# Patient Record
Sex: Female | Born: 1935 | Hispanic: Refuse to answer
Health system: Midwestern US, Community
[De-identification: ages and names within clinical notes are randomized; demographics above are authoritative.]

## PROBLEM LIST (undated history)

## (undated) DIAGNOSIS — M5416 Radiculopathy, lumbar region: Secondary | ICD-10-CM

## (undated) DIAGNOSIS — R748 Abnormal levels of other serum enzymes: Secondary | ICD-10-CM

## (undated) DIAGNOSIS — R159 Full incontinence of feces: Secondary | ICD-10-CM

## (undated) DIAGNOSIS — R011 Cardiac murmur, unspecified: Secondary | ICD-10-CM

---

## 2019-03-25 NOTE — Progress Notes (Signed)
Self Swab Type: Anterior Nasal

## 2022-01-20 NOTE — Telephone Encounter (Signed)
Dr. Tamala Julian has accepted her as a new patient and we will call her to schedule an appt.

## 2022-01-21 NOTE — Telephone Encounter (Signed)
PT called, but VM Box has not been set up

## 2022-01-23 NOTE — Telephone Encounter (Signed)
I spoke with pt - appt made for 6/5 @ 11 AM

## 2022-01-31 NOTE — Telephone Encounter (Signed)
Pt called to confirm her 02/03/22 appt

## 2022-02-03 ENCOUNTER — Ambulatory Visit: Admit: 2022-02-03 | Discharge: 2022-02-03 | Payer: MEDICARE | Attending: Internal Medicine | Primary: Internal Medicine

## 2022-02-03 DIAGNOSIS — I6522 Occlusion and stenosis of left carotid artery: Secondary | ICD-10-CM

## 2022-02-03 LAB — COMPREHENSIVE METABOLIC PANEL
ALT: 32 U/L (ref 0–35)
AST: 36 U/L — ABNORMAL HIGH (ref 0–35)
Albumin/Globulin Ratio: 1.1 (ref 1.00–2.70)
Albumin: 3.9 g/dL (ref 3.5–5.2)
Alk Phosphatase: 125 U/L — ABNORMAL HIGH (ref 35–117)
Anion Gap: 9 mmol/L (ref 2–17)
BUN: 13 mg/dL (ref 8–23)
CO2: 29 mmol/L (ref 22–29)
Calcium: 9.6 mg/dL (ref 8.8–10.2)
Chloride: 100 mmol/L (ref 98–107)
Creatinine: 0.7 mg/dL (ref 0.5–1.0)
Est, Glom Filt Rate: 85 mL/min/{1.73_m2} (ref >=60)
Globulin: 3.4 g/dL (ref 1.9–4.4)
Glucose: 92 mg/dL (ref 70–99)
OSMOLALITY CALCULATED: 275 mOsm/kg (ref 270–287)
Potassium: 4.8 mmol/L (ref 3.5–5.3)
Sodium: 138 mmol/L (ref 135–145)
Total Bilirubin: 0.39 mg/dL (ref 0.00–1.20)
Total Protein: 7.3 g/dL (ref 6.4–8.3)

## 2022-02-03 LAB — TSH: TSH, 3RD GENERATION: 1.74 mcIU/mL (ref 0.358–3.740)

## 2022-02-03 LAB — CBC WITH AUTO DIFFERENTIAL
Absolute Baso #: 0.1 10*3/uL (ref 0.0–0.2)
Absolute Eos #: 0.2 10*3/uL (ref 0.0–0.5)
Absolute Lymph #: 1.9 10*3/uL (ref 1.0–3.2)
Absolute Mono #: 0.6 10*3/uL (ref 0.3–1.0)
Basophils %: 1.3 % (ref 0.0–2.0)
Eosinophils %: 2.3 % (ref 0.0–7.0)
Hematocrit: 42.6 % (ref 34.0–47.0)
Hemoglobin: 13.6 g/dL (ref 11.5–15.7)
Immature Grans (Abs): 0.01 10*3/uL (ref 0.00–0.06)
Immature Granulocytes: 0.1 % (ref 0.0–0.6)
Lymphocytes: 27.3 % (ref 15.0–45.0)
MCH: 29.4 pg (ref 27.0–34.5)
MCHC: 31.9 g/dL — ABNORMAL LOW (ref 32.0–36.0)
MCV: 92.2 fL (ref 81.0–99.0)
MPV: 10.8 fL (ref 7.2–13.2)
Monocytes: 8.2 % (ref 4.0–12.0)
NRBC Absolute: 0 10*3/uL (ref 0.000–0.012)
NRBC Automated: 0 % (ref 0.0–0.2)
Neutrophils %: 60.8 % (ref 42.0–74.0)
Neutrophils Absolute: 4.3 10*3/uL (ref 1.6–7.3)
Platelets: 263 10*3/uL (ref 140–440)
RBC: 4.62 x10e6/mcL (ref 3.60–5.20)
RDW: 14 % (ref 11.0–16.0)
WBC: 7 10*3/uL (ref 3.8–10.6)

## 2022-02-03 LAB — LIPID PANEL
Chol/HDL Ratio: 3.8 (ref 0.0–4.4)
Cholesterol: 224 mg/dL — ABNORMAL HIGH (ref 100–200)
HDL: 59 mg/dL (ref >=50)
LDL Cholesterol: 139.2 mg/dL — ABNORMAL HIGH (ref 0.0–100.0)
LDL/HDL Ratio: 2.4
Triglycerides: 129 mg/dL (ref 0–149)
VLDL: 25.8 mg/dL (ref 5.0–40.0)

## 2022-02-03 LAB — VITAMIN B12: Vitamin B-12: 449 pg/mL (ref 232–1245)

## 2022-02-04 ENCOUNTER — Telehealth

## 2022-02-04 NOTE — Telephone Encounter (Signed)
-----   Message from Leafy Ro, MD sent at 02/03/2022 11:29 PM EDT -----  Notify liver enzymes slightly elevated otherwise labs okay please recheck CMP before she comes back in 3 months for her next appointment diagnosis elevated liver enzymes

## 2022-02-05 NOTE — Progress Notes (Signed)
CHIEF COMPLAINT:  Chief Complaint   Patient presents with    New Patient        HISTORY OF PRESENT ILLNESS:  [name] is a 86 y.o. female  who presents today to establish as a new patient.  She says she is from [place] and has relocated to [place].  She said she saw doctors infrequently but does carry a diagnosis of carotid stenosis and a heart murmur she thinks the aortic valve.  She is living in an apartment now she is a little bit socially isolated.  Her children do not live in the area she does have a son in [place] who travels quite a bit.  She sold her house on [address] because it is difficult to maintain the upkeep and she could not tolerate the ice and snow in the winter.  She has back pain which bothers her throughout the day.  She has not been falling.  She does not get much activity does not leave her house very much.  She complains of difficulty with urinary and fecal incontinence recently.  She says she was riding one of the carts in the store and somebody informed her that she was incontinent because she was unaware of it.  She says fecal incontinence does not happen every week but urinary incontinence is frequent.  She feels her short-term memory may not be very good but she is able to pay her bills and manage her household affairs.  Usually has difficulty recalling names.    PHQ:  PHQ-9  02/03/2022   Little interest or pleasure in doing things 0   Feeling down, depressed, or hopeless 0   PHQ-2 Score 0   PHQ-9 Total Score 0       CURRENT MEDICATION LIST:    No current outpatient medications on file.     No current facility-administered medications for this visit.        ALLERGIES:    No Known Allergies     HISTORY:  History reviewed. No pertinent past medical history.   Past Surgical History:   Procedure Laterality Date    LITHOTRIPSY      TONSILLECTOMY AND ADENOIDECTOMY        Social History     Socioeconomic History    Marital status: Divorced     Spouse name: Not on file    Number  of children: Not on file    Years of education: Not on file    Highest education level: Not on file   Occupational History    Not on file   Tobacco Use    Smoking status: Never    Smokeless tobacco: Never   Substance and Sexual Activity    Alcohol use: Never    Drug use: Never    Sexual activity: Not Currently     Partners: Male   Other Topics Concern    Not on file   Social History Narrative    Not on file     Social Determinants of Health     Financial Resource Strain: Not on file   Food Insecurity: Not on file   Transportation Needs: Not on file   Physical Activity: Not on file   Stress: Not on file   Social Connections: Not on file   Intimate Partner Violence: Not on file   Housing Stability: Not on file      Family History   Problem Relation Age of Onset    Diabetes Mother  No Known Problems Father     Cancer Brother         Throat Cancer    Alzheimer's Disease Brother     Diabetes Paternal Grandfather         REVIEW OF SYSTEMS:  Pertinent items are noted in HPI.    PHYSICAL EXAM:  Vital Signs -   Visit Vitals  BP 132/70   Pulse 74   Resp 16   Ht '5\' 8"'  (1.727 m)   Wt 197 lb (89.4 kg)   SpO2 97%   BMI 29.95 kg/m    Body mass index is 29.95 kg/m.   1) Well-nourished, well-developed in no acute distress   2) PERRLA    3) oropharynx is clear with moist mucous membranes  4) neck is supple with no lymphadenopathy   5) thyroid is not enlarged   6) lungs are clear to auscultation bilaterally with good air movement   7) respiratory effort is normal with no accessory muscle recruitment   8) heart's regular rate and rhythm with normal S1 and normal S2   9) there is no clubbing or cyanosis   10)there is no edema   11)no rashes, lesions or ulcers   12)alert and oriented to person place and time      LABS  Results for orders placed or performed in visit on 02/03/22   CBC with Auto Differential   Result Value Ref Range    WBC 7.0 3.8 - 10.6 x10e3/mcL    RBC 4.62 3.60 - 5.20 x10e6/mcL    Hemoglobin 13.6 11.5 - 15.7 g/dL     Hematocrit 42.6 34.0 - 47.0 %    MCV 92.2 81.0 - 99.0 fL    MCH 29.4 27.0 - 34.5 pg    MCHC 31.9 (L) 32.0 - 36.0 g/dL    RDW 14.0 11.0 - 16.0 %    Platelets 263 140 - 440 x10e3/mcL    MPV 10.8 7.2 - 13.2 fL    NRBC Automated 0.0 0.0 - 0.2 %    NRBC Absolute 0.000 0.000 - 0.012 x10e3/mcL    Neutrophils % 60.8 42.0 - 74.0 %    Lymphocytes 27.3 15.0 - 45.0 %    Monocytes 8.2 4.0 - 12.0 %    Eosinophils % 2.3 0.0 - 7.0 %    Basophils % 1.3 0.0 - 2.0 %    Neutrophils Absolute 4.3 1.6 - 7.3 x10e3/mcL    Absolute Lymph # 1.9 1.0 - 3.2 x10e3/mcL    Absolute Mono # 0.6 0.3 - 1.0 x10e3/mcL    Absolute Eos # 0.2 0.0 - 0.5 x10e3/mcL    Absolute Baso # 0.1 0.0 - 0.2 x10e3/mcL    Immature Granulocytes 0.1 0.0 - 0.6 %    Immature Grans (Abs) 0.01 0.00 - 0.06 x10e3/mcL   Comprehensive Metabolic Panel   Result Value Ref Range    Sodium 138 135 - 145 mmol/L    Potassium 4.8 3.5 - 5.3 mmol/L    Chloride 100 98 - 107 mmol/L    CO2 29 22 - 29 mmol/L    Glucose 92 70 - 99 mg/dL    BUN 13 8 - 23 mg/dL    Creatinine 0.7 0.5 - 1.0 mg/dL    Anion Gap 9 2 - 17 mmol/L    OSMOLALITY CALCULATED 275 270 - 287 mOsm/kg    Calcium 9.6 8.8 - 10.2 mg/dL    Total Protein 7.3 6.4 - 8.3 g/dL    Albumin 3.9 3.5 -  5.2 g/dL    Globulin 3.4 1.9 - 4.4 g/dL    Albumin/Globulin Ratio 1.10 1.00 - 2.70    Total Bilirubin 0.39 0.00 - 1.20 mg/dL    Alk Phosphatase 125 (H) 35 - 117 unit/L    AST 36 (H) 0 - 35 unit/L    ALT 32 0 - 35 unit/L    Est, Glom Filt Rate 85 >=60 mL/min/1.17m  Lipid Panel   Result Value Ref Range    Cholesterol 224 (H) 100 - 200 mg/dL    HDL 59 >=50 mg/dL    Triglycerides 129 0 - 149 mg/dL    LDL Cholesterol 139.2 (H) 0.0 - 100.0 mg/dL    LDL/HDL Ratio 2.4     Chol/HDL Ratio 3.8 0.0 - 4.4    VLDL 25.8 5.0 - 40.0 mg/dL   TSH   Result Value Ref Range    TSH, 3RD GENERATION 1.740 0.358 - 3.740 mcIU/mL   Vitamin B12   Result Value Ref Range    Vitamin B-12 449 232 - 1245 pg/mL     No results found for any previous visit.        IMPRESSION/PLAN    1. Stenosis of left carotid artery  Comments:  [place] 2. Aortic valve sclerosis  Comments:  Appears asymptomatic, get echo results  3. Chronic lumbar radiculopathy  Comments:  Noted, arrange physical therapy  Orders:  -     [facility] - [facility] - [address]  4. Elevated blood pressure reading  Comments:  Asked her to monitor blood pressure at home, check labs  Orders:  -     CBC with Auto Differential  -     Comprehensive Metabolic Panel  5. Elevated lipids  Comments:  Check lipid panel  Orders:  -     Lipid Panel  6. Stress incontinence of urine  Comments:  Referral to urology, probably some element of pelvic floor dysfunction  Orders:  -     MRosezetta Schlatter MD - Urology  7. Incontinence of feces, unspecified fecal incontinence type  Comments:  As above, referral to colorectal specialty  Orders:  -     [facility] - Lagares-Garcia, JChipper Oman MD, Colorectal Surgery - [address] 8. Cognitive impairment  Comments:  Check labs including B12, TSH RPR  Orders:  -     TSH  -     Vitamin B12  9. Vitamin B12 deficiency anemia due to intrinsic factor deficiency  Comments:  Check B12 level  Orders:  -     TSH         Follow up and Dispositions:  Return in about 3 months (around 05/06/2022) for FOLLOW-UP.       CLeafy Ro MD

## 2022-02-11 ENCOUNTER — Ambulatory Visit: Admit: 2022-02-11 | Discharge: 2022-02-11 | Payer: MEDICARE | Attending: Family | Primary: Internal Medicine

## 2022-02-11 DIAGNOSIS — R159 Full incontinence of feces: Secondary | ICD-10-CM

## 2022-02-11 MED ORDER — PSYLLIUM 0.52 G PO CAPS
0.52 g | ORAL_CAPSULE | Freq: Two times a day (BID) | ORAL | 2 refills | Status: AC
Start: 2022-02-11 — End: 2022-11-12

## 2022-02-11 NOTE — Progress Notes (Incomplete)
[facility]    Primary Care Provider: Orlando Penner, MD   Referring Physician:       Dear Dr. Bonnetta Barry ref. provider found :    I had the pleasure of seeing your patient, Ashley Davies in the office today at [facility]. I am attaching the following encounter for your review as well as the current assessment and plan.    I would like to thank you for the referral and allowing me to participate in the healthcare of this patient.    Please, do not hesitate to contact me for any questions regarding her care.    Best Regards,    Clide Dales, DNP, APRN, FNP-BC, RN-BC, CCCN  Division Colon and Rectal Surgery  [facility]  [facility]  [address], [address]  [place], [place] [address]  Phone [phone]      Ashley Davies (DOB:  25-May-1936) is a 86 y.o. female,New patient, here for evaluation of the following chief complaint(s):No chief complaint on file.     No chief complaint on file.         ASSESSMENT/PLAN:  {There are no diagnoses linked to this encounter. (Refresh or delete this SmartLink)}  There are no diagnoses linked to this encounter.  No orders of the defined types were placed in this encounter.     No follow-ups on file.       We have discussed a combination use of fiber therapy and adequate hydration to medically control the soiling:  Metamucil 2 tablets twice a day or any other over the counter fiber supplement, and/or adding some imodium if diarrhea is the main culprit of incontinence  Counseling given: Not Answered      Following a stepladder approach:  Anorectal Manometric studies  2. Consideration for PNE testing in the office for candidacy for Sacral Nerve Stimulation (SNS)  3. SNS permanent implant placement  4. Colostomy education and consideration for WOCN consultation.  5. Sphincter injury repair (overlapping sphincteroplasty)    Ashley Davies at this time has decided to  ***        Subjective    SUBJECTIVE/OBJECTIVE:  HPI    86 year old female referred to our clinic for fecal incontinence by per PCP.  She reports difficulty with urinary and fecal incontinence recently.  She was riding one of the carts in the store recently and somebody informed her that she was incontinent because she was unaware of it.  She says fecal incontinence does not happen every week but urinary incontinence is frequent.   She has been referred to Urology for further workup and management of her urinary incontinence.      Her surgical history includes lithotripsy and tonsillectomy/adenoidectomy.  Also had appendectomy about 10 years ago.  Three vaginal deliveries each requiring episiotomies.  Fecal incontinence for the past year.  Two week ago went to [facility] and had a fecal accident without awareness.  She also notes one other time in the past year where she messed on the floor.      Not currently on a bowel regimen.  Main issues is fecal urgency in the morning.  Only one bowel movement per day in the morning that is solid followed by liquid.        She is constantly leaking urine.  Wears a pad all the time.  Last colonoscopy was 2-3 years ago, normal.  No family history of colon cancer.  Son has  Crohn's.  She does not.        her [facility] Fecal Incontinence Score is currently 4    Range (0-20) Never-0 Rarely-1 Sometimes-2  Usually-3 Always-4  Solid stool  2  Liquid stool  2  Gas   0  Pad use  0  Lifestyle restriction 0  Total score  4    No past medical history on file.  Past Surgical History:   Procedure Laterality Date   . LITHOTRIPSY     . TONSILLECTOMY AND ADENOIDECTOMY       Prior to Admission medications    Not on File     No Known Allergies  Social History     Socioeconomic History   . Marital status: Divorced     Spouse name: Not on file   . Number of children: Not on file   . Years of education: Not on file   . Highest education level: Not on file   Occupational History   . Not on file   Tobacco Use   . Smoking  status: Never   . Smokeless tobacco: Never   Substance and Sexual Activity   . Alcohol use: Never   . Drug use: Never   . Sexual activity: Not Currently     Partners: Male   Other Topics Concern   . Not on file   Social History Narrative   . Not on file     Social Determinants of Health     Financial Resource Strain: Not on file   Food Insecurity: Not on file   Transportation Needs: Not on file   Physical Activity: Not on file   Stress: Not on file   Social Connections: Not on file   Intimate Partner Violence: Not on file   Housing Stability: Not on file     Family History   Problem Relation Age of Onset   . Diabetes Mother    . No Known Problems Father    . Cancer Brother         Throat Cancer   . Alzheimer's Disease Brother    . Diabetes Paternal Grandfather      Cancer-related family history includes Cancer in her brother.     Review of Systems  General/Constitutional:   Fatigue denies. Fever denies. Headache denies. Weight gain denies. Weight loss denies.   ENT:   Decreased hearing denies. Nosebleed denies. Sore throat denies.   Ophthalmologic:   Change in vision denies.   Endocrine:   Dizziness denies.   Cardiovascular:   Chest Pain denies. Orthopnea denies.   Gastrointestinal:   {Ros - gi:15461}  Musculoskeletal:   Painful joints denies. Swollen joints denies.   Skin:   Dry skin denies. Itching denies. Rash denies.   Genitourinary:   Blood in urine denies. Painful urination denies.  Neurologic:   Dizziness denies. Seizures denies.   Psychiatric:   Depressed mood denies. Difficulty sleeping denies.           Objective   There were no vitals filed for this visit.  There is no height or weight on file to calculate BMI.     Physical Exam  Exam conducted with a chaperone {HSP GEN CHAPERONE:210460216}  Constitutional:       Appearance: Normal appearance.   HENT:      Head: Normocephalic and atraumatic.      Nose: Nose normal.      Mouth/Throat:      Mouth: Mucous membranes are moist.  Eyes:      Conjunctiva/sclera:  Conjunctivae normal.      Pupils: Pupils are equal, round, and reactive to light.   Cardiovascular:      Rate and Rhythm: Normal rate and regular rhythm.      Pulses: Normal pulses.      Heart sounds: Normal heart sounds.   Pulmonary:      Effort: Pulmonary effort is normal.      Breath sounds: Normal breath sounds.     Abdominal:      General: Abdomen is flat. Bowel sounds are normal. There is no distension.      Palpations: Abdomen is soft.      Tenderness: There is no abdominal tenderness. There is no guarding or rebound. Negative signs include Murphy's sign and McBurney's sign.      Hernia: No hernia is present.     Genitourinary:     General: Normal female genitalia     Exam position: {EXAM POSITION:19197::"Prone jackknife on the proctology exam table","Supine on the proctology exam table"}     Rectum: {RECTAL EXAMINATION:19196::"Normal intact perianal skin","Normal resting and squeeze tone","No masses palpable","No evidence of fissures,fistula or abscess","No evidence of external thrombosed hemorrhoids","Thrombosed hemorrhoid located on ***","Ulcerated anal mass *** cm approximately located on ***","Rectal mass palpable at *** cm located ***","normal perianal sensation","intact perineal floor","rectocele","anal fistula located at *** in the prone jackknife position","fluctuant ischiorectal mass located at *** position"}    Office procedures:    ANOSCOPY was {ANOSCOPY:19197::"performed","not performed"}  Findings:***  RIGID PROCTOSCOPY was {RIGID PROCTOSCOPY:19197::"performed","not performed"}  Findings:***  Musculoskeletal:      Cervical back: Normal range of motion and neck supple.   Lymphadenopathy:      Lower Body: No right inguinal adenopathy. No left inguinal adenopathy.   Skin:     General: Skin is warm.   Neurological:      Mental Status: she   is alert.     her most recent labs in our system have been reviewed:  Recent Results (from the past 2016 hour(s))   CBC with Auto Differential    Collection  Time: 02/03/22 12:12 PM   Result Value Ref Range    WBC 7.0 3.8 - 10.6 x10e3/mcL    RBC 4.62 3.60 - 5.20 x10e6/mcL    Hemoglobin 13.6 11.5 - 15.7 g/dL    Hematocrit 16.1 09.6 - 47.0 %    MCV 92.2 81.0 - 99.0 fL    MCH 29.4 27.0 - 34.5 pg    MCHC 31.9 (L) 32.0 - 36.0 g/dL    RDW 04.5 40.9 - 81.1 %    Platelets 263 140 - 440 x10e3/mcL    MPV 10.8 7.2 - 13.2 fL    NRBC Automated 0.0 0.0 - 0.2 %    NRBC Absolute 0.000 0.000 - 0.012 x10e3/mcL    Neutrophils % 60.8 42.0 - 74.0 %    Lymphocytes 27.3 15.0 - 45.0 %    Monocytes 8.2 4.0 - 12.0 %    Eosinophils % 2.3 0.0 - 7.0 %    Basophils % 1.3 0.0 - 2.0 %    Neutrophils Absolute 4.3 1.6 - 7.3 x10e3/mcL    Absolute Lymph # 1.9 1.0 - 3.2 x10e3/mcL    Absolute Mono # 0.6 0.3 - 1.0 x10e3/mcL    Absolute Eos # 0.2 0.0 - 0.5 x10e3/mcL    Absolute Baso # 0.1 0.0 - 0.2 x10e3/mcL    Immature Granulocytes 0.1 0.0 - 0.6 %    Immature Grans (Abs) 0.01 0.00 -  0.06 x10e3/mcL   Comprehensive Metabolic Panel    Collection Time: 02/03/22 12:12 PM   Result Value Ref Range    Sodium 138 135 - 145 mmol/L    Potassium 4.8 3.5 - 5.3 mmol/L    Chloride 100 98 - 107 mmol/L    CO2 29 22 - 29 mmol/L    Glucose 92 70 - 99 mg/dL    BUN 13 8 - 23 mg/dL    Creatinine 0.7 0.5 - 1.0 mg/dL    Anion Gap 9 2 - 17 mmol/L    OSMOLALITY CALCULATED 275 270 - 287 mOsm/kg    Calcium 9.6 8.8 - 10.2 mg/dL    Total Protein 7.3 6.4 - 8.3 g/dL    Albumin 3.9 3.5 - 5.2 g/dL    Globulin 3.4 1.9 - 4.4 g/dL    Albumin/Globulin Ratio 1.10 1.00 - 2.70    Total Bilirubin 0.39 0.00 - 1.20 mg/dL    Alk Phosphatase 865 (H) 35 - 117 unit/L    AST 36 (H) 0 - 35 unit/L    ALT 32 0 - 35 unit/L    Est, Glom Filt Rate 85 >=60 mL/min/1.48m   Lipid Panel    Collection Time: 02/03/22 12:12 PM   Result Value Ref Range    Cholesterol 224 (H) 100 - 200 mg/dL    HDL 59 >=78 mg/dL    Triglycerides 469 0 - 149 mg/dL    LDL Cholesterol 629.5 (H) 0.0 - 100.0 mg/dL    LDL/HDL Ratio 2.4     Chol/HDL Ratio 3.8 0.0 - 4.4    VLDL 25.8 5.0 - 40.0  mg/dL   TSH    Collection Time: 02/03/22 12:12 PM   Result Value Ref Range    TSH, 3RD GENERATION 1.740 0.358 - 3.740 mcIU/mL   Vitamin B12    Collection Time: 02/03/22 12:12 PM   Result Value Ref Range    Vitamin B-12 449 232 - 1245 pg/mL               {Time Documentation Optional:210461321}      An electronic signature was used to authenticate this note.    --Clide Dales, APRN - NP , DNP, APRN, FNP-BC, RN-BC, CCCN

## 2022-02-13 ENCOUNTER — Telehealth

## 2022-02-13 NOTE — Telephone Encounter (Signed)
Pt called in she was referred over to [facility] , pt states she cant afford to go there they want $80.00 per session, she states she would like to go to [facility] for her pelvic floor exercises they are $25.00 per session ,she goes to the at  [address] [place], [place] [address]. Telephone number [phone] Fax [phone]. She  has an appt scheduled with them 07/03 at 66

## 2022-02-13 NOTE — Telephone Encounter (Signed)
Referral is attached.  Please send to [facility] noted in previous message.  Thank you.

## 2022-02-27 ENCOUNTER — Encounter: Payer: MEDICARE | Attending: Surgery | Primary: Internal Medicine

## 2022-02-27 NOTE — Telephone Encounter (Signed)
Patient stated she is additional 15 minutes late. I did sent a TE.

## 2022-03-03 LAB — CULTURE, URINE: FINAL REPORT: NO GROWTH

## 2022-03-06 ENCOUNTER — Ambulatory Visit
Admit: 2022-03-06 | Discharge: 2022-03-06 | Payer: MEDICARE | Attending: Colon & Rectal Surgery | Primary: Internal Medicine

## 2022-03-06 DIAGNOSIS — R151 Fecal smearing: Secondary | ICD-10-CM

## 2022-03-06 NOTE — Progress Notes (Signed)
[facility]    Primary Care Provider: Orlando Penner, MD   Referring Physician:       Dear Dr. Bonnetta Barry ref. provider found :    I had the pleasure of seeing your patient, Ashley Davies in the office today at [facility]. I am attaching the following encounter for your review as well as the current assessment and plan.    I would like to thank you for the referral and allowing me to participate in the healthcare of this patient.    Please, do not hesitate to contact me for any questions regarding her   Care.    Best Regards,           ASSESSMENT/PLAN: No anorectal malignancy. Advice given for perianal hygiene. She is going to under go pelvic floor physical therapy            SUBJECTIVE/OBJECTIVE:Ashley Davies was seen by Donette Larry for fecal incontinence. Anorectal exam showd a possible rectal mass. Ashley Davies is here for evaluation of this. She has no other anorectal complaints. She has had one overt episode of fecal incontinence but has some smearing. Mybetrique has resolved her issues with urinary incontinence      Past Medical History:   Diagnosis Date    Hx of spinal cord injury     Leaking of urine      Past Surgical History:   Procedure Laterality Date    LITHOTRIPSY      TONSILLECTOMY AND ADENOIDECTOMY       Prior to Admission medications    Medication Sig Start Date End Date Taking? Authorizing Provider   Mirabegron (MYRBETRIQ PO) Take by mouth   Yes Historical Provider, MD   psyllium 0.52 g capsule Take 2 capsules by mouth in the morning and at bedtime 02/11/22  Yes Clide Dales, APRN - NP     No Known Allergies  Social History     Socioeconomic History    Marital status: Divorced     Spouse name: Not on file    Number of children: Not on file    Years of education: Not on file    Highest education level: Not on file   Occupational History    Not on file   Tobacco Use    Smoking status: Never    Smokeless tobacco: Never   Substance and Sexual Activity    Alcohol  use: Never    Drug use: Never    Sexual activity: Not Currently     Partners: Male   Other Topics Concern    Not on file   Social History Narrative    Not on file     Social Determinants of Health     Financial Resource Strain: Not on file   Food Insecurity: Not on file   Transportation Needs: Not on file   Physical Activity: Not on file   Stress: Not on file   Social Connections: Not on file   Intimate Partner Violence: Not on file   Housing Stability: Not on file     Family History   Problem Relation Age of Onset    Diabetes Mother     No Known Problems Father     Cancer Brother         Throat Cancer    Alzheimer's Disease Brother     Diabetes Paternal Grandfather      Cancer-related family history includes Cancer in her brother.     General/Constitutional  Fever denies.  Denies Chills.  Denies Fatigue.  Weight change denies.         Allergy/Immunology          Denies Congestion.  Denies Cough.  Denies Sneezing.         Ophthalmologic          Denies Blurred vision.         Endocrine          Denies Cold intolerance.  Denies Excessive sweating.  Denies Excessive thirst.  Denies Heat intolerance.         Respiratory          Shortness of breath  denies.         Cardiovascular          Chest pain  denies.  Heart problems  denies.  Circulatory problems  denies.  Chest pain while exerting yourself  Denies. Irregular heartbeat  Denies .Palpitations  Denies .         Gastrointestinal          Dark Tarry Stool denies.  Blood in stool Denies.  Change in bowel habits  Denies .  Constipation denies.  Decreased appetite  Denies .  Diarrhea denies.  Nausea  Denies .     Rectal bleeding  Denies.  Vomiting  Denies.        Hematology          AIDS/HIV  denies.  Excessive bleeding after surgery or medical procedure  denies.         Genitourinary          Denies Difficulty urinating.         Neurologic          Denies Balance difficulty.  Denies Coordination trouble.  Denies Dizziness.  Denies Fainting.  Denies Gait  abnormality.      Colorectal  Loss of bowel/bladder control  Denies.    Accidental loss or leakage of stool-sometimes doesn't make it to the bathroom in time. Denies.    Bowel accidents-no warning, when passing gas or while asleep  Denies.    Frequent, loose, watery stool  Denies.    Sudden or strong urge to go to the bathroom  Denies.           Objective   Vitals:    03/06/22 1353   BP: (!) 189/78   Pulse: 76   Temp: 98.1 F (36.7 C)   SpO2: 94%        Physical Exam  Constitutional:       Appearance: Normal appearance.   HENT:      Head: Normocephalic.   Cardiovascular:      Rate and Rhythm: Normal rate.   Pulmonary:      Effort: Pulmonary effort is normal.   Abdominal:      General: Abdomen is flat.      Palpations: Abdomen is soft.   Genitourinary:     Comments: Chronic appearing prolapsed internal/external hemorrhoids digital exam shows no masses anoscopy does not show any lesions some dry stool around her perianal region  Musculoskeletal:         General: Normal range of motion.      Cervical back: Normal range of motion.   Skin:     General: Skin is warm and dry.   Neurological:      General: No focal deficit present.      Mental Status: She is alert.   Psychiatric:  Mood and Affect: Mood normal.         Thought Content: Thought content normal.         Judgment: Judgment normal.              An electronic signature was used to authenticate this note.    --Inetta Fermo, MD

## 2022-03-10 ENCOUNTER — Telehealth

## 2022-03-10 NOTE — Telephone Encounter (Signed)
Referral entered, pt notified.

## 2022-03-10 NOTE — Telephone Encounter (Signed)
Please call and advise when the referral is in the system      --states she needs a referral for aquatic physical therapy  referral for back pain  --she wants to go to the pool at [facility] to [employer]

## 2022-03-17 ENCOUNTER — Inpatient Hospital Stay: Admit: 2022-03-17 | Payer: MEDICARE | Primary: Internal Medicine

## 2022-03-17 DIAGNOSIS — M549 Dorsalgia, unspecified: Secondary | ICD-10-CM

## 2022-03-17 DIAGNOSIS — M5416 Radiculopathy, lumbar region: Secondary | ICD-10-CM

## 2022-03-17 NOTE — Progress Notes (Signed)
[facility]   [address] [place] [address]  Phone: [phone]  Fax: [phone]  Outpatient Physical Therapy Evaluation Episode    Ashley Davies   (86 y.o. female)  DOB Sep 22, 1935    Medical & Insurance Info PT Plan of Care & Visit info   Referring Physician: Orlando Penner, MD Plan of Care Certification Period 03/17/22 to 06/09/22   Date of Onset   Onset Date: 04/12/18 Frequency   Plan Frequency: 2x  Plan weeks: 12   Primary Insurance: [employer] Choice-ppo Medicare  Secondary Insurance:  PT Visit Info  Total # of Visits to Date: 1     Progress Note Counter: 1   Medical Diagnosis: Chronic lumbar radiculopathy [M54.16]  Visit Diagnosis  Visit Diagnoses         Codes    Dorsalgia    -  Primary M54.9          Past Medical History/Comorbidities  [name]  has a past medical history of Hx of spinal cord injury and Leaking of urine.  [name]  has a past surgical history that includes Tonsillectomy and adenoidectomy and Lithotripsy.    Allergies Patient has no known allergies.    Learning   Learning  Does the patient/guardian have any barriers to learning?: Physical, Other (comment) (Poor ambulation tolerance)  What is the preferred language of the patient/guardian?: English    Restrictions/Precautions   Restrictions/Precautions  Restrictions/Precautions:  (Poor standing/ambulation tolerance)     SUBJECTIVE   Subjective    Pt is an 56 WF with hx of chronic back pain stemming from a fall over 10 yrs ago. Pt states that it was recommended she have surgery at the time for her spine, but she refused. She states that she has been dealing with the pain, but "enough is enough" she states as she has become very sedentary and "stiff." She notes increased difficulty with standing and ambulation due to pain. Pt denies any radiating LE pain. She has been referred to PT for aquatic therapy. Pt denies bowel or bladder incontinence. Her goals for PT are to lessen pain and walk better.      Prior Level of Function/Work/Activity   Prior level of function: mod I  Occupation: Retired       Chief Executive Officer History    Social History  Lives With: Alone    Pain  7-8/10      OBJECTIVE   Observation:   Pt unable to stand fully erect due to back pain, increased thoracic kyphosis with forward head positioned into full cervical extension  Pt presented in WC, but can ambulate using gait, with shuffling gait, no heel contact, shoes skim the floor "I know I need to pick my feet up, but this is easier."    Myotome LE testing 4-/5  Sensation intact and symmetrical in Les   UE AROM is moderately reduced as she can only elevate her arms to 90 deg.     Functional Outcome Measures:  TUG 25 sec  ODI 60%                                                             PT Treatment Completed:  N/A - Evaluation Only  ASSESSMENT   Assessment Pt ensures therapist that her  incontinence has been well managed with new medications. Pt denies any urine or fecal incontinence. She will benefit from continued skilled PT to address chronic back pain to improve ADL and functional mobility.     Education  PT Education  PT Education: Goals, PT Role, Plan of Care, Evaluative findings     Impairments  Decreased functional mobility , Decreased ADL status, Decreased ROM, Decreased strength, Decreased endurance, Decreased posture, Increased pain, Decreased balance    Therapy Prognosis  Good    Eval Complexity  Decision Making: High Complexity  PLAN   Interventions Planned(Treatment may consist of any combination of the following):    Current Treatment Recommendations: Aquatics    Next Visit Recommendation initiate aquatics       Goals  Patient Goal(s):    Short Term Goals Completed by   Goal Status   Pt will have <8/10 LBP when standing 5 min to cook a meal.     Pt will habe <7/10 LBP at rest to improve restorative sleep.     Pt will have increased lumbar extension AROM to reach overhead without increased pain.                               Long Term  Goals Completed by 90 days Goal Status   Pt will be independent with HEP to transition to community pool.     Pt will have improved TUG time to <14 sec to reduce fall risk.                                     Total Duration [id number]     Insurance Visit Details   Charge Capture     Therapist Signature: Everett Graff, PT    Date: 5/62/1308     I certify that the above therapy services are being furnished while the patient is under my care. I agree with the treatment plan and certify that this therapy is necessary.      Physician Signature:Smith, Joycie Peek, MD    _______________________________ Date _____________      Please sign and return to [facility] OP THERAPY PT.  Please fax to the location listed above. THANK YOU for this referral!

## 2022-03-24 ENCOUNTER — Encounter: Payer: MEDICARE | Primary: Internal Medicine

## 2022-03-26 ENCOUNTER — Encounter: Payer: MEDICARE | Primary: Internal Medicine

## 2022-03-28 NOTE — Telephone Encounter (Signed)
Pt is calling to see if she could get more samples of myrbetriq please call pt to advise

## 2022-03-31 ENCOUNTER — Encounter: Payer: MEDICARE | Primary: Internal Medicine

## 2022-03-31 NOTE — Telephone Encounter (Signed)
Kindly note that Myrbetriq is a Urology drug and would need to be managed by her Urologist (or primary if prescribed by her primary care provider).  Looks like she was referred to Dr. Gaynell Face.  Thanks.

## 2022-04-02 ENCOUNTER — Encounter: Payer: MEDICARE | Primary: Internal Medicine

## 2022-04-07 DIAGNOSIS — M5416 Radiculopathy, lumbar region: Secondary | ICD-10-CM

## 2022-04-08 ENCOUNTER — Inpatient Hospital Stay: Payer: MEDICARE | Primary: Internal Medicine

## 2022-04-09 ENCOUNTER — Inpatient Hospital Stay: Admit: 2022-04-09 | Payer: MEDICARE | Primary: Internal Medicine

## 2022-04-09 NOTE — Progress Notes (Signed)
[facility]   [address] [place] [address]  Phone: [phone]  Fax: [phone]  Outpatient Physical Therapy TreatmentEpisode    Ashley Davies   (86 y.o. female)  DOB 01-01-1936    Medical & Insurance Info PT Plan of Care & Visit info   Referring Physician: Orlando Penner, MD Plan of Care/Certification Expiration Date: 06/09/22     Onset Date: 04/12/18   Plan Frequency: 2x    Plan weeks: 12     Primary Insurance: [employer] Choice-ppo Medicare  Secondary Insurance:  PT Visit Info  Total # of Visits to Date: 2  Total # of Visits Approved: 30 (Eval and then 10 visits approved by insurance from 03/19/2022-06/17/2022)    Progress Note Counter: 2/11   Medical Diagnosis: Chronic lumbar radiculopathy [M54.16]  Treatment Diagnosis: Low back pain - abnormality of gait    Past Medical History/Comorbidities  Ashley Davies  has a past medical history of Hx of spinal cord injury and Leaking of urine.  Ashley Davies  has a past surgical history that includes Tonsillectomy and adenoidectomy and Lithotripsy.  Allergies   Patient has no known allergies.  Learning   Learning  Does the patient/guardian have any barriers to learning?: Physical; Other (comment) (Poor ambulation tolerance)  What is the preferred language of the patient/guardian?: English      Restrictions/Precautions   Restrictions/Precautions: Other (comment) (Poor standing/ambulation tolerance - USE LIFT CHAIR TO ENTER AND EXIT POOL)     SUBJECTIVE   Subjective  States long history of LBP - today, with pain level 5.5/10 - centralized LBA.  States that she has a dtr in [place] and a son in [place] and a dtr who lives in [place].   States that just walking in the pool "makes my back feel better."  Pain  Pain Screening  Patient Currently in Pain: Yes (See above)   OBJECTIVE   PT Treatment Completed:  Exercises:  Aquatic therapy (CPT [phone])   Treatment Reasoning    Exercise 1: INTO POOL VIA LIFT CHAIR  Exercise 2: Hip flexor  stretches - 20 sec - 4  Exercise 3: 4 way hip - 2x10 ea  Exercise 4: Step ups - forward - 10 ea  Exercise 5: On 1st step - mini squats - 10  Exercise 6: HS stretches - 30 sec - 2 ea  Exercise 7: Amb forward/backward/sideways - 10 each  Exercise 8: HC stretches - 30 sec 2    Limitations addressed: Mobility, Strength, Flexibility, Posture, Activity tolerance, Pain modulation  Therapist provided: Verbal cuing  Functional ability(s) targeted: Ambulating community distances, Performing self care actvities, Tolerance to age appropriate activities   1:1 Time (minutes): 23  Unbillable time (minutes): 22  Total Time: 45         ASSESSMENT   Assessment:  Amb to dept  - flexed at hips and knees - gait broad based with ext rotation B LE's - therapist adjusted cane down 3 slots and pt agreed that she was able to use it better for support.  No complaints during treatment.  Education  PT Education: Teacher, music of condition  Patient Education: Need to stretch pl fl, hip fl and HS - for better alignment/ability to walk - Pool therex  Barriers impacting rehab : None   PLAN   Interventions Planned(Treatment may consist of any combination of the following):    Current Treatment Recommendations: Aquatics    Next Visit Recommendation   Cont gentle aquatics  Goals  Patient Goal(s):    Short Term Goals    Goal Status   Pt will have <8/10 LBP when standing 5 min to cook a meal.     Pt will habe <7/10 LBP at rest to improve restorative sleep.     Pt will have increased lumbar extension AROM to reach overhead without increased pain.                               Long Term Goals Completed by   90 days Goal Status   Pt will be independent with HEP to transition to community pool.     Pt will have improved TUG time to <14 sec to reduce fall risk.                                   Total Duration   Individual Time In: 1125       Individual Time Out: 1210  Minutes: 45    Insurance Visit Details   Charge Capture     Therapist Signature: Hilton Sinclair, PT    Date: 04/09/2022

## 2022-04-14 ENCOUNTER — Inpatient Hospital Stay: Admit: 2022-04-14 | Payer: MEDICARE | Primary: Internal Medicine

## 2022-04-14 NOTE — Progress Notes (Signed)
[facility]   [address] [place] [address]  Phone: [phone]  Fax: [phone]  Outpatient Physical Therapy TreatmentEpisode    Ashley Davies   (86 y.o. female)  DOB [birth year]    Medical & Insurance Info PT Plan of Care & Visit info   Referring Physician: Orlando Penner, MD Plan of Care/Certification Expiration Date: 06/09/22     Onset Date: 04/12/18   Plan Frequency: 2x    Plan weeks: 12     Primary Insurance: [employer] Choice-ppo Medicare  Secondary Insurance:  PT Visit Info  Total # of Visits to Date: 3  Total # of Visits Approved: 11    Progress Note Counter: 3/11   Medical Diagnosis: Chronic lumbar radiculopathy [M54.16]  Treatment Diagnosis: Low back pain - abnormality of gait    Past Medical History/Comorbidities  Ashley Davies  has a past medical history of Hx of spinal cord injury and Leaking of urine.  Ashley Davies  has a past surgical history that includes Tonsillectomy and adenoidectomy and Lithotripsy.  Allergies   Patient has no known allergies.  Learning   Learning  Does the patient/guardian have any barriers to learning?: Physical; Other (comment) (Poor ambulation tolerance)  What is the preferred language of the patient/guardian?: English      Restrictions/Precautions   Restrictions/Precautions: Other (comment) (Poor standing/ambulation tolerance - USE LIFT CHAIR TO ENTER AND EXIT POOL)   No data recorded  SUBJECTIVE   Subjective  (30 min late) "I am looking for a new appt-on the phone" Pt reports her back pain is not too bad today.  Pain  Pain Screening  Patient Currently in Pain: Yes   OBJECTIVE   PT Treatment Completed:  Exercises:  Aquatic therapy (CPT [phone])   Treatment Reasoning    Exercise 1: INTO POOL VIA LIFT CHAIR  Exercise 2: Hip flexor stretches - 20 sec - 4  Exercise 3: 4 way hip - 2x10 ea  Exercise 6: HS stretches - 30 sec - 2 ea  Exercise 7: Amb forward/backward/sideways - 10 each  Exercise 8: HC stretches - 30 sec 2    Limitations  addressed: Mobility, Strength, Flexibility, Posture, Activity tolerance, Pain modulation   1:1 Time (minutes): 25               ASSESSMENT   Assessment:  amb to dept via st. cane- some CGA to lift chair-in-out pool via lift chair- Pt performed per flow sheet but 30 mins late -Pt reports aquatics has helped-encouraged to be mindfull of appt time to benefit from full aquatics.Cont with POC  Education  PT Education: PT Role  Patient Education: Need to stretch pl fl, hip fl and HS - for better alignment/ability to walk - Pool therex-arrive on time   PLAN   Interventions Planned(Treatment may consist of any combination of the following):    Current Treatment Recommendations: Aquatics    Next Visit Recommendation   Cont with POC   Goals  Patient Goal(s):    Short Term Goals    Goal Status   Pt will have <8/10 LBP when standing 5 min to cook a meal.     Pt will habe <7/10 LBP at rest to improve restorative sleep.     Pt will have increased lumbar extension AROM to reach overhead without increased pain.  Long Term Goals Completed by   90 days Goal Status   Pt will be independent with HEP to transition to community pool.     Pt will have improved TUG time to <14 sec to reduce fall risk.                                   Total Duration   Individual Time In: 1200       Individual Time Out: 1230  Minutes: 30Timed Code Treatment Minutes: 25 Minutes   Insurance Visit Details   Charge Capture     Therapist Signature: Massie Maroon, PTA    Date: 04/14/2022

## 2022-04-16 ENCOUNTER — Inpatient Hospital Stay: Admit: 2022-04-16 | Payer: MEDICARE | Primary: Internal Medicine

## 2022-04-16 NOTE — Progress Notes (Signed)
[facility]   [address] [place] [address]  Phone: [phone]  Fax: [phone]  Outpatient Physical Therapy TreatmentEpisode    Ashley Davies   (86 y.o. female)  DOB October 07, 1935    Medical & Insurance Info PT Plan of Care & Visit info   Referring Physician: Orlando Penner, MD Plan of Care/Certification Expiration Date: 06/09/22     Onset Date: 04/12/18   Plan Frequency: 2x    Plan weeks: 12     Primary Insurance: [employer] Choice-ppo Medicare  Secondary Insurance:  PT Visit Info  Total # of Visits to Date: 4  Total # of Visits Approved: 11    Progress Note Counter: 4/11   Medical Diagnosis: Chronic lumbar radiculopathy [M54.16]  Treatment Diagnosis: Low back pain - abnormality of gait    Past Medical History/Comorbidities  Ashley Davies  has a past medical history of Hx of spinal cord injury and Leaking of urine.  Ashley Davies  has a past surgical history that includes Tonsillectomy and adenoidectomy and Lithotripsy.  Allergies   Patient has no known allergies.  Learning   Learning  Does the patient/guardian have any barriers to learning?: Physical; Other (comment) (Poor ambulation tolerance)  What is the preferred language of the patient/guardian?: English      Restrictions/Precautions   Restrictions/Precautions: Other (comment) (Poor standing/ambulation tolerance - USE LIFT CHAIR TO ENTER AND EXIT POOL)     SUBJECTIVE   Subjective  "My back is improving.."  States pain level 2/10 LBA this am.  States that she plans to try to get a rollator so that she can put her heavy pool bag on it and get into PT more easily.  Pain  Pain Screening  Patient Currently in Pain: Yes (See above)   OBJECTIVE   PT Treatment Completed:  Exercises:  Aquatic therapy (CPT [phone])   Treatment Reasoning    Exercise 1: INTO POOL VIA LIFT CHAIR  Exercise 2: Hip flexor stretches - 20 sec - 4  Exercise 3: 4 way hip - 2x10 ea  Exercise 4: Step ups - forward - 2x10 ea - lateral 10 ea  Exercise 5: On 1st  step - mini squats - 10  Exercise 6: HS stretches - 30 sec - 2 ea  Exercise 7: Amb forward/backward/sideways - 10 each  Exercise 8: HC stretches - 30 sec 2    Limitations addressed: Mobility, Strength, Flexibility, Posture, Activity tolerance, Pain modulation  Therapist provided: Verbal cuing  Progressed: Complexity of movement, Repetitions  Progressed: Added lat step ups - increased reps with forward step ups  Functional ability(s) targeted: Ambulating community distances, Performing self care actvities, Tolerance to age appropriate activities   1:1 Time (minutes): 23  Unbillable time (minutes): 20  Total Time: 43         ASSESSMENT   Assessment:  Amb to dept with st cane - posture crouched - B LE's ext rot/broad based gait - taking small steps.  No complaints during therex.  Moving more easily after aquatic PT.  Education  Patient Education: Need to stretch pl fl, hip fl and HS - for better alignment/ability to walk -  Barriers impacting rehab : None   PLAN   Interventions Planned(Treatment may consist of any combination of the following):    Current Treatment Recommendations: Aquatics    Next Visit Recommendation   Cont gentle aquatics  Goals  Patient Goal(s):    Short Term Goals    Goal Status   Pt  will have <8/10 LBP when standing 5 min to cook a meal.     Pt will habe <7/10 LBP at rest to improve restorative sleep.     Pt will have increased lumbar extension AROM to reach overhead without increased pain.                               Long Term Goals Completed by   90 days Goal Status   Pt will be independent with HEP to transition to community pool.     Pt will have improved TUG time to <14 sec to reduce fall risk.                                   Total Duration   Individual Time In: 1130       Individual Time Out: 1215  Minutes: 45Timed Code Treatment Minutes: 25 Minutes   Insurance Visit Details   Charge Capture     Therapist Signature: Hilton Sinclair, PT    Date: 04/16/2022

## 2022-04-17 NOTE — Telephone Encounter (Signed)
Patient states that she needs a prescription for a walker with a seat. She states that its covered under Medicare part B. Please call and advise.      Sent to [facility]

## 2022-04-17 NOTE — Telephone Encounter (Signed)
Yes. Orders for a rollator walker have been submitted online to [facility] and they will reach out to patient to arrange delivery, etc. Thank you!

## 2022-04-17 NOTE — Telephone Encounter (Signed)
Pt notified.

## 2022-04-21 ENCOUNTER — Inpatient Hospital Stay: Admit: 2022-04-21 | Payer: MEDICARE | Primary: Internal Medicine

## 2022-04-21 NOTE — Progress Notes (Signed)
[facility]   [address] [place] [address]  Phone: [phone]  Fax: [phone]  Outpatient Physical Therapy TreatmentEpisode    Deidrea Gaetz   (86 y.o. female)  DOB 26-Oct-1935    Medical & Insurance Info PT Plan of Care & Visit info   Referring Physician: Orlando Penner, MD Plan of Care/Certification Expiration Date: 06/09/22     Onset Date: 04/12/18   Plan Frequency: 2x    Plan weeks: 12     Primary Insurance: [employer] Choice-ppo Medicare  Secondary Insurance:  PT Visit Info  Total # of Visits Approved: 11  Total # of Visits to Date: 5  Progress Note Counter: 5/11    Medical Diagnosis: Chronic lumbar radiculopathy [M54.16]    Treatment Diagnosis: Low back pain - abnormality of gait    Past Medical History/Comorbidities  Ms. Minardi  has a past medical history of Hx of spinal cord injury and Leaking of urine.  Ms. Lomeli  has a past surgical history that includes Tonsillectomy and adenoidectomy and Lithotripsy.  Allergies Patient has no known allergies.    Learning   Learning  Does the patient/guardian have any barriers to learning?: Physical; Other (comment) (Poor ambulation tolerance)  What is the preferred language of the patient/guardian?: English      Restrictions/Precautions   Restrictions/Precautions: Other (comment) (Poor standing/ambulation tolerance - USE LIFT CHAIR TO ENTER AND EXIT POOL)       SUBJECTIVE   Subjective  The patient states her back pain is ~7/10. She states water therapy has been helping.     OBJECTIVE   PT Treatment Completed:  Exercises:  Aquatic therapy (CPT [id number])   Treatment Reasoning    INTO POOL VIA LIFT CHAIR  Amb forward/backward/sideways - 5x each  Standing Marching 20x  3 way hip 10x ea. R/L  Noodle Traction 10 minutes  Noodle bicycling 8 minutes  Noodle hip abd 3x10    Unable/too painful:  HS stretches - 30 sec - 2 ea  HC stretches - 30 sec 2  Hip flexor stretches - 20 sec - 4  Step ups - forward - 2x10 ea - lateral 10 ea  On 1st  step - mini squats - 10     Limitations addressed: Mobility, Strength, Flexibility, Posture, Activity tolerance, Pain modulation  Therapist provided: Verbal cuing  Progressed: Complexity of movement, Repetitions  Progressed: Added lat step ups - increased reps with forward step ups  Functional ability(s) targeted: Ambulating community distances, Performing self care actvities, Tolerance to age appropriate activities   1:1 Time (minutes): 45  Total Time: 45          ASSESSMENT   Assessment:Patient to dept with RW, crouched posture. Patient reports decreased back pain and increased ease of movement in water in general. Patient developed increased back pain when attempting stretching on step and unable to tolerate. Held further therex on step and WB and progressesd patient with noodle traction and bicycling which patient immediately had improved lbp, tolerating very well.  Cueing during rx for improving upright posture.    PLAN   Interventions Planned(Treatment may consist of any combination of the following):    Current Treatment Recommendations: Aquatics    Goals  Patient Goal(s):    Short Term Goals Completed by     Goal Status   Pt will have <8/10 LBP when standing 5 min to cook a meal.     Pt will habe <7/10 LBP at rest to improve restorative sleep.  Pt will have increased lumbar extension AROM to reach overhead without increased pain.                               Long Term Goals Completed by   90 days Goal Status   Pt will be independent with HEP to transition to community pool.     Pt will have improved TUG time to <14 sec to reduce fall risk.                                   Total Duration   Individual Time In: 1125      Individual Time Out: 1210    Charge Capture     Therapist Signature: Gerlene Fee, PT    Date: 04/21/2022     1

## 2022-04-23 ENCOUNTER — Encounter: Payer: MEDICARE | Primary: Internal Medicine

## 2022-04-23 NOTE — Telephone Encounter (Signed)
Pt states she cannot come in because she cannot walk across the floor. She doesn't have anyone that can help her or drive her here.  She is contemplating going to the ER because she is in so much pain.  She does not want to come into the office.

## 2022-04-23 NOTE — Telephone Encounter (Signed)
The patient states that she is in excruciating back pain and is requesting a pain medication    States that the walker he prescribed for her is too heavy and caused even more pain in her back    The patient states that she is unable to walk and will need to cancel her Aquatic therapy today    [facility] # [phone] was confirmed    On board for Dr Ofilia Neas and sent to Orlie Dakin

## 2022-04-23 NOTE — Telephone Encounter (Signed)
Pt notified.

## 2022-04-26 ENCOUNTER — Inpatient Hospital Stay
Admit: 2022-04-26 | Discharge: 2022-04-26 | Disposition: A | Discharge status: Home / Self Care | Payer: MEDICARE | Attending: Emergency Medicine

## 2022-04-26 DIAGNOSIS — S39012A Strain of muscle, fascia and tendon of lower back, initial encounter: Secondary | ICD-10-CM

## 2022-04-26 MED ORDER — OXYCODONE HCL 5 MG PO TABS
5 MG | ORAL_TABLET | Freq: Four times a day (QID) | ORAL | 0 refills | Status: AC | PRN
Start: 2022-04-26 — End: 2022-04-29

## 2022-04-26 NOTE — ED Notes (Signed)
Pt. Provided a walker     Raphael Gibney, RN  04/26/22 1139

## 2022-04-26 NOTE — ED Provider Notes (Addendum)
[facility] EMERGENCY DEPT  EMERGENCY DEPARTMENT ENCOUNTER      Pt Name: Ashley Davies  MRN: [id number]  Birthdate 1935/11/17  Date of evaluation: 04/26/2022  Provider: Carlota Raspberry, MD    CHIEF COMPLAINT       Chief Complaint   Patient presents with    Back Pain     Pt states she injured her back last Wednesday lifting her heavy walker and it isn't getting any better. A&Ox4         HISTORY OF PRESENT ILLNESS    The history is provided by the patient.     86 year old female presents with some intractable lumbar discomfort.  It is nonradiating migratory and started after a lifting injury.  No low back pain red flags.  No relief with ibuprofen.  No urinary complaints.  No history of significant back problems    Nursing Notes were reviewed.    REVIEW OF SYSTEMS                                                                      Review of Systems    Except as noted above the remainder of the review of systems was reviewed and negative.       PAST MEDICAL HISTORY     Past Medical History:   Diagnosis Date    Hx of spinal cord injury     Leaking of urine        SURGICAL HISTORY       Past Surgical History:   Procedure Laterality Date    LITHOTRIPSY      TONSILLECTOMY AND ADENOIDECTOMY         CURRENT MEDICATIONS       Previous Medications    MIRABEGRON (MYRBETRIQ PO)    Take by mouth    PSYLLIUM 0.52 G CAPSULE    Take 2 capsules by mouth in the morning and at bedtime       ALLERGIES     Patient has no known allergies.    FAMILY HISTORY       Family History   Problem Relation Age of Onset    Diabetes Mother     No Known Problems Father     Cancer Brother         Throat Cancer    Alzheimer's Disease Brother     Diabetes Paternal Grandfather         SOCIAL HISTORY       Social History     Socioeconomic History    Marital status: Divorced     Spouse name: None    Number of children: None    Years of education: None    Highest education level: None   Tobacco Use    Smoking status: Never    Smokeless tobacco: Never   Substance  and Sexual Activity    Alcohol use: Never    Drug use: Never    Sexual activity: Not Currently     Partners: Male       SCREENINGS       PHYSICAL EXAM       ED Triage Vitals   BP Temp Temp Source Pulse Respirations SpO2 Height Weight - Scale   04/26/22 1022 04/26/22 1022 04/26/22 1022  04/26/22 1022 04/26/22 1022 04/26/22 1022 04/26/22 1029 04/26/22 1022   (!) 144/84 97.8 F (36.6 C) Oral 79 16 96 % 5\' 8"  (1.727 m) 190 lb (86.2 kg)       Gen:  Alert, no acute distress  Heart: Regular rate and rhythm without murmur  Lungs: Clear to auscultation, no distress  Abdomen: No pulsatile mass  Musculoskeletal: No swelling or deformity.  Symmetric peripheral pulses.  No midline thoracolumbar discomfort.  Neurologic: No focal motor or sensory deficits, no dysreflexia    DIAGNOSTIC RESULTS     EKG: All EKG's are interpreted by the Emergency Department Physician who either signs or Co-signs this chart in the absence of a cardiologist.    RADIOLOGY    Non-plain film images such as CT, Ultrasound and MRI are read by the radiologist. Plain radiographic images are visualized and preliminarily interpreted by the emergency physician with the below findings:    Interpretation per the Radiologist below, if available at the time of this note:    No orders to display       LABS:  Labs Reviewed - No data to display    All other labs were within normal range or not returned as of this dictation.    EMERGENCY DEPARTMENT COURSE/REASSESSMENT and MDM:   Medical Decision Making  Risk  Prescription drug management.        ED Course as of 04/26/22 1051   Sat Apr 26, 2022   1035 Benign examination consistent with muscle strain.  I will provide some supplemental pain control along with some other nonmedicinal tips.  I explained that this may take several weeks to resolve.  Referred to her physician as needed for follow-up. [RE]   1051 Before discharge the patient expressed to the nurse that she has concerns about going home and feels she may need to  be admitted.  She would not really meet criteria for admission and in fact does not really even require imaging clinically.  We will contact case management to see if there are any options for short-term assistance with daily living needs [RE]      ED Course User Index  [RE] Apr 28, 2022, MD       PROCEDURES   Procedures        CONSULTS:  IP CONSULT TO CASE MANAGEMENT    FINAL IMPRESSION      1. Back strain, initial encounter          DISPOSITION/PLAN   DISPOSITION Decision To Discharge 04/26/2022 10:36:28 AM      PATIENT REFERRED TO:  04/28/2022, MD  [address]  [address]  [place]  [phone]    In 1 week  As needed      DISCHARGE MEDICATIONS:  New Prescriptions    OXYCODONE (ROXICODONE) 5 MG IMMEDIATE RELEASE TABLET    Take 1 tablet by mouth every 6 hours as needed for Pain for up to 3 days. Intended supply: 3 days. Take lowest dose possible to manage pain Max Daily Amount: 20 mg     Controlled Substances Monitoring:     No flowsheet data found.    (Please note that portions of this note were completed with a voice recognition program.  Efforts were made to edit the dictations but occasionally words are mis-transcribed.)    [phone], MD (electronically signed)  Attending Emergency Physician           Carlota Raspberry, MD  04/26/22 1037  Carlota Raspberry, MD  04/26/22 1052

## 2022-04-26 NOTE — Care Coordination-Inpatient (Signed)
CM consulted to assist with setting up [facility] services. Met with pt bedside and discussed providers- [facility] being [employer] preferred provider but [facility] also an option as they are in network. Pt opted for [facility]per.   Referral sent  MSW needed for: resources- meals on wheels, long term care planning and how to use grocery delivery systems and cabs as pt may get rid of car to cut costs.

## 2022-04-28 ENCOUNTER — Telehealth

## 2022-04-28 NOTE — Telephone Encounter (Signed)
The patient called back to request a call back from the MA regarding her walker that she received    She states that its the wrong one and states that it's too heavy and needs an aluminum kind which is lighter     She states that someone thought she was saying wider.     Please call Ms Semel back to confirm that a new order will  be done    Sent to [employer]

## 2022-04-28 NOTE — Telephone Encounter (Signed)
Patient called in.     I informed her of the message louise left on the prior message. No call back needed.

## 2022-04-28 NOTE — Telephone Encounter (Signed)
noted 

## 2022-04-28 NOTE — Telephone Encounter (Signed)
I sent a request to [employer] asking if they can exchange what she received last week from them for a lighter model. If they can, then they will contact patient to arrange an exchange.

## 2022-04-28 NOTE — Telephone Encounter (Signed)
Referral entered for aquatic center.  Sallye Ober, can you order a walker please?

## 2022-04-28 NOTE — Telephone Encounter (Signed)
Patient states that she needs a order for additional physical therapy. She states that she also needs a new light weight walker. She states that she hurt her back lifting her walker, it was too heavy. She states that she can't walk because of the pain.   Please call and advise.    Sent [facility]

## 2022-04-29 ENCOUNTER — Inpatient Hospital Stay: Admit: 2022-04-29 | Payer: MEDICARE | Primary: Internal Medicine

## 2022-04-29 NOTE — Progress Notes (Signed)
[facility]   [address] [place] [address]  Phone: [phone]  Fax: [phone]  Outpatient Physical Therapy TreatmentEpisode    Ashley Davies   (86 y.o. female)  DOB August 29, 1936    Medical & Insurance Info PT Plan of Care & Visit info   Referring Physician: Orlando Penner, MD Plan of Care/Certification Expiration Date: 06/09/22     Onset Date: 04/12/18   Plan Frequency: 2x    Plan weeks: 12     Primary Insurance: [employer] Medicare  Secondary Insurance:  PT Visit Info  Total # of Visits to Date: 6  Total # of Visits Approved: 11    Progress Note Counter: 6/11   Medical Diagnosis: Chronic lumbar radiculopathy [M54.16]  Treatment Diagnosis: Low back pain - abnormality of gait    Past Medical History/Comorbidities  Ashley Davies  has a past medical history of Hx of spinal cord injury and Leaking of urine.  Ashley Davies  has a past surgical history that includes Tonsillectomy and adenoidectomy and Lithotripsy.  Allergies   Patient has no known allergies.  Learning   Learning  Does the patient/guardian have any barriers to learning?: Physical; Other (comment) (Poor ambulation tolerance)  What is the preferred language of the patient/guardian?: English      Restrictions/Precautions   Restrictions/Precautions: Other (comment) (Poor standing/ambulation tolerance - USE LIFT CHAIR TO ENTER AND EXIT POOL)   No data recorded  SUBJECTIVE   Subjective   Pt is 30 min late today stating I can't walk well.  Secretary helped pt to locker room with her bag, another employee was asked to help her in the bathroom. She does not have family to help her get to PT with more ease.   Secretary notes she smells of a BM.  I did ask pt if she had a BM and she states "I'm fine".  Did note feces on pool chair after pt entered the water.  Pain      OBJECTIVE   PT Treatment Completed:    Exercises:  Aquatic therapy (CPT [phone])   Treatment Reasoning    INTO POOL VIA LIFT CHAIR  Amb  forward/backward/sideways - 5x each  Standing Marching 20x  3 way hip 10x ea. R/L  Noodle Traction 10 minutes  Noodle bicycling 8 minutes  Noodle hip abd 3x10   HS stretches - 30 sec - 2 ea    Unable/too painful:  HC stretches - 30 sec 2  Hip flexor stretches - 20 sec - 4  Step ups - forward - 2x10 ea - lateral 10 ea  On 1st step - mini squats - 10      ** Got pt a RW to amb out of pool today- pt taking extra time to stand up and amb to locker room. Limitations addressed: Mobility, Strength, Flexibility, Posture, Activity tolerance, Pain modulation  Therapist provided: Verbal cuing  Progressed: Complexity of movement, Repetitions  Progressed: Added lat step ups - increased reps with forward step ups  Functional ability(s) targeted: Ambulating community distances, Performing self care actvities, Tolerance to age appropriate activities   1:1 Time (minutes): 50   Total Time: 50          ASSESSMENT   Assessment:  Patient to dept with cane, needing A from 2 empolyees's to get to locker and to have further A.in the bathroom , crouched posture. Patient reports decreased back pain and increased ease of movement in water in general. Patient developed increased back  pain when attempting HC stretching and with turning in the water.  Held further therex on step and WB and progressesd patient with noodle traction and bicycling with noodle in shallow water and holding railing at times.  Cueing during rx for improving upright posture.  She has a walker yet states she strained her back trying to get it out of her car thus using her cane.  Education    Pt is getting a HH aide- suggested to ask about getting A to her PT appointments.  PLAN   Interventions Planned(Treatment may consist of any combination of the following):    Current Treatment Recommendations: Aquatics    Next Visit Recommendation   Cont gentle aquatics  Goals  Patient Goal(s):    Short Term Goals    Goal Status   Pt will have <8/10 LBP when standing 5 min to cook a  meal.     Pt will habe <7/10 LBP at rest to improve restorative sleep.     Pt will have increased lumbar extension AROM to reach overhead without increased pain.                               Long Term Goals Completed by   90 days Goal Status   Pt will be independent with HEP to transition to community pool.     Pt will have improved TUG time to <14 sec to reduce fall risk.                                   Total Duration   Individual Time In: 1200       Individual Time Out: 1250      Charge Capture     Therapist Signature: Boneta Lucks, [place]    Date: 04/29/2022

## 2022-05-09 ENCOUNTER — Telehealth

## 2022-05-09 NOTE — Telephone Encounter (Signed)
Patient would like a referral to a spine doctor.   She fractured her back several years ago. A  few weeks ago she was putting her walker in her car trunk and sprained her back.   She is having really bad back pain.  Please call and advise.    Sent to [facility]

## 2022-05-09 NOTE — Telephone Encounter (Signed)
Patient will not be able to make it to her 9/12 appointment. She is having issues walking.     She will call back when her back issues get straightened out.

## 2022-05-09 NOTE — Telephone Encounter (Addendum)
Patient is in [place] [facility]. She was requesting handrails in her master bathroom. They told her she would need an order from a doctor. Please call patient to advise.

## 2022-05-09 NOTE — Telephone Encounter (Signed)
Appt cancelled

## 2022-05-12 NOTE — Telephone Encounter (Signed)
Referral entered and mailed note to landlord.

## 2022-05-12 NOTE — Telephone Encounter (Signed)
[address]  [place]  [address]

## 2022-05-13 ENCOUNTER — Encounter: Payer: MEDICARE | Primary: Internal Medicine

## 2022-05-13 ENCOUNTER — Encounter: Payer: MEDICARE | Attending: Internal Medicine | Primary: Internal Medicine

## 2022-05-14 ENCOUNTER — Encounter: Payer: MEDICARE | Primary: Internal Medicine

## 2022-05-16 NOTE — Telephone Encounter (Signed)
Left msg with info on vm

## 2022-05-16 NOTE — Telephone Encounter (Signed)
[facility]: [phone]

## 2022-05-16 NOTE — Telephone Encounter (Signed)
Please call     --states she has a walker in the trunk and she needs to return it to the company that it was ordered from  --states when she picked up this walker it was to heavy and she strained her fractured  back  --will you call her with the name of the company that the walker was ordered from and the phone number so she can call them to come pick it up?    Sent to [employer]

## 2022-05-16 NOTE — Telephone Encounter (Signed)
Which DME company was used?

## 2022-05-23 NOTE — Telephone Encounter (Signed)
ERROR

## 2022-05-23 NOTE — Telephone Encounter (Signed)
Spoke to [employer], they will fax the packet over for completion to have her Shower rails added.

## 2022-05-23 NOTE — Telephone Encounter (Signed)
Ashley Davies from [employer], states that the patient states that there was some papers missing that Dr.Smith said came with the packet she received.   Ashley Davies needs to know what was exactly on the paper and what was it for. Patient is getting a railing put in her bathroom at the apartment.  If Ashley Davies is out you can speak with   Ashley Davies or Ashley Davies    Please call and advise    Sent to [facility]

## 2022-09-22 ENCOUNTER — Telehealth

## 2022-09-22 NOTE — Telephone Encounter (Signed)
Patient called in stating that she needed a refill from Dr. Tamala Julian    When asked what she needed she stated she didnt know.     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 I told her i was confused because none of those words are medications. "She said i know, it is a condition of some sort. Am i just supposed to let this go untreated?"     She stated she found this paper in a pocket book. The paper was dated 02/03/2022    Please call patient to clarify.Marland KitchenMarland Kitchen

## 2022-09-22 NOTE — Telephone Encounter (Signed)
Called, ordered echo, and scheduled appt 10/16/22 9:30

## 2022-09-25 NOTE — Telephone Encounter (Signed)
FYI            Pt states that she hs test done at the hospital in [place] she states that it was the echo cardio gram that he  ordered & states that he wanted the records of the test done & was having problems getting the records because he didn't have the correct name of hospital    Pt states that the correct name of the hospital is [facility] & she states that they keep the records for 10 years    And this all the information that I could get from Pt because she was yelling & being very impatient over the phone      Sent to [employer]

## 2022-09-25 NOTE — Telephone Encounter (Signed)
Mailing her a medical request form. Their fax number is [phone]

## 2022-10-16 ENCOUNTER — Encounter: Payer: MEDICARE | Attending: Internal Medicine | Primary: Internal Medicine

## 2022-10-16 NOTE — Progress Notes (Signed)
NO SHOWED FOR APPT

## 2022-11-06 ENCOUNTER — Inpatient Hospital Stay: Payer: PRIVATE HEALTH INSURANCE | Primary: Internal Medicine

## 2022-11-06 ENCOUNTER — Inpatient Hospital Stay: Admit: 2022-11-07 | Payer: PRIVATE HEALTH INSURANCE | Primary: Internal Medicine

## 2022-11-06 ENCOUNTER — Emergency Department: Admit: 2022-11-06 | Payer: PRIVATE HEALTH INSURANCE | Primary: Internal Medicine

## 2022-11-06 ENCOUNTER — Inpatient Hospital Stay
Admission: EM | Admit: 2022-11-06 | Discharge: 2022-11-12 | Disposition: A | Discharge status: Hospice | Payer: PRIVATE HEALTH INSURANCE | Admitting: Internal Medicine

## 2022-11-06 ENCOUNTER — Inpatient Hospital Stay: Admit: 2022-11-06 | Payer: PRIVATE HEALTH INSURANCE | Primary: Internal Medicine

## 2022-11-06 DIAGNOSIS — A419 Sepsis, unspecified organism: Secondary | ICD-10-CM

## 2022-11-06 DIAGNOSIS — I6522 Occlusion and stenosis of left carotid artery: Secondary | ICD-10-CM

## 2022-11-06 LAB — BLOOD GAS, ARTERIAL
BE ECF ART: -5.2 mmol/L — ABNORMAL LOW (ref <=2.0)
Base Excess, Arterial: -4.8 mmol/L — ABNORMAL LOW (ref <=2.0)
Carboxyhgb, Arterial: 1 % (ref 0.0–2.0)
DEOXY HGB ART: <2.4 %
FIO2 Arterial: 100 %
HCO3, Arterial: 20.6 mmol/L — ABNORMAL LOW (ref 22.0–26.0)
HGB, Arterial: 13 g/dL (ref 11.0–17.3)
Methemoglobin, Arterial: 0.4 % (ref 0.4–1.5)
O2 CAP ART: 17.8 mL/dL
O2 Sat, Arterial: >99.2 % — ABNORMAL HIGH (ref 95.0–97.0)
Oxyhemoglobin: >99.1 % — ABNORMAL HIGH (ref 90.0–95.0)
Temperature: 36.9 C
pCO2 Corr Art: 38.2 mmHg (ref 36.0–46.0)
pCO2, Arterial: 38.4 mmHg (ref 36.0–46.0)
pH Corr Art: 7.339 mmHg — ABNORMAL LOW (ref 7.350–7.450)
pH, Arterial: 7.337 — ABNORMAL LOW (ref 7.350–7.450)
pO2 Corr Art: 321 mmHg — ABNORMAL HIGH (ref 80.0–100.0)
pO2, Arterial: 322 mmHg — ABNORMAL HIGH (ref 80.0–100.0)

## 2022-11-06 LAB — ETHANOL: Ethanol Lvl: NOT DETECTED mg/dL (ref 0.0–10.0)

## 2022-11-06 LAB — TSH: TSH, 3RD GENERATION: 1.41 mcIU/mL (ref 0.358–3.740)

## 2022-11-06 LAB — PROCALCITONIN: Procalcitonin: 0.92 ng/mL — ABNORMAL HIGH (ref <=0.24)

## 2022-11-06 LAB — POCT GLUCOSE
POC Glucose: 123 mg/dL — ABNORMAL HIGH (ref 65.0–110.0)
POC Glucose: 129 mg/dL — ABNORMAL HIGH (ref 65.0–110.0)

## 2022-11-06 LAB — CULTURE, URINE: FINAL REPORT: NO GROWTH

## 2022-11-06 LAB — CBC WITH AUTO DIFFERENTIAL
Absolute Baso #: 0.1 10*3/uL (ref 0.0–0.2)
Absolute Eos #: 0 10*3/uL (ref 0.0–0.5)
Absolute Lymph #: 0.6 10*3/uL — ABNORMAL LOW (ref 1.0–3.2)
Absolute Mono #: 2.4 10*3/uL — ABNORMAL HIGH (ref 0.3–1.0)
Basophils %: 0.3 % (ref 0.0–2.0)
Eosinophils %: 0 % (ref 0.0–7.0)
Hematocrit: 44.7 % (ref 34.0–47.0)
Hemoglobin: 14.3 g/dL (ref 11.5–15.7)
Immature Grans (Abs): 0.09 10*3/uL — ABNORMAL HIGH (ref 0.00–0.06)
Immature Granulocytes: 0.4 % (ref 0.0–0.6)
Lymphocytes: 2.6 % — ABNORMAL LOW (ref 15.0–45.0)
MCH: 28.8 pg (ref 27.0–34.5)
MCHC: 32 g/dL (ref 32.0–36.0)
MCV: 90.1 fL (ref 81.0–99.0)
MPV: 10.8 fL (ref 7.2–13.2)
Monocytes: 11.1 % (ref 4.0–12.0)
NRBC Absolute: 0 10*3/uL (ref 0.000–0.012)
NRBC Automated: 0 % (ref 0.0–0.2)
Neutrophils %: 85.6 % — ABNORMAL HIGH (ref 42.0–74.0)
Neutrophils Absolute: 18.6 10*3/uL — ABNORMAL HIGH (ref 1.6–7.3)
Platelets: 280 10*3/uL (ref 140–440)
RBC: 4.96 x10e6/mcL (ref 3.60–5.20)
RDW: 14 % (ref 11.0–16.0)
WBC: 21.7 10*3/uL — ABNORMAL HIGH (ref 3.8–10.6)

## 2022-11-06 LAB — COMPREHENSIVE METABOLIC PANEL
ALT: 82 U/L — ABNORMAL HIGH (ref 0–35)
AST: 87 U/L — ABNORMAL HIGH (ref 0–35)
Albumin/Globulin Ratio: 0.8 — ABNORMAL LOW (ref 1.00–2.70)
Albumin: 3.2 g/dL — ABNORMAL LOW (ref 3.5–5.2)
Alk Phosphatase: 106 U/L (ref 35–117)
Anion Gap: 21 mmol/L — ABNORMAL HIGH (ref 2–17)
BUN: 182 mg/dL — ABNORMAL HIGH (ref 8–23)
CALCIUM,CORRECTED,CCA: 10.1 mg/dL (ref 8.8–10.2)
CO2: 23 mmol/L (ref 22–29)
Calcium: 9.5 mg/dL (ref 8.8–10.2)
Chloride: 101 mmol/L (ref 98–107)
Creatinine: 2.8 mg/dL — ABNORMAL HIGH (ref 0.5–1.0)
Est, Glom Filt Rate: 16 mL/min/1.73m — ABNORMAL LOW (ref >=60)
Globulin: 3.9 g/dL (ref 1.9–4.4)
Glucose: 161 mg/dL — ABNORMAL HIGH (ref 70–99)
OSMOLALITY CALCULATED: 347 mOsm/kg — ABNORMAL HIGH (ref 270–287)
Potassium: 4 mmol/L (ref 3.5–5.3)
Sodium: 145 mmol/L (ref 135–145)
Total Bilirubin: 1.02 mg/dL (ref 0.00–1.20)
Total Protein: 7.1 g/dL (ref 6.4–8.3)

## 2022-11-06 LAB — LACTIC ACID
Lactic Acid: 3.3 mmol/L (ref 0.5–2.0)
Lactic Acid: 2.2 mmol/L — ABNORMAL HIGH (ref 0.5–2.0)

## 2022-11-06 LAB — URINALYSIS W/ RFLX MICROSCOPIC
Bilirubin Urine: NEGATIVE
Blood, Urine: NEGATIVE
Glucose, UA: NEGATIVE
Ketones, Urine: NEGATIVE mg/dL
Leukocyte Esterase, Urine: NEGATIVE
Nitrite, Urine: NEGATIVE
Protein, UA: NEGATIVE
Specific Gravity, UA: 1.019 (ref 1.003–1.035)
Urobilinogen, Urine: 1 EU/dL (ref >1.0)
pH, UA: 5.5 (ref 4.5–8.0)

## 2022-11-06 LAB — URINE DRUG SCREEN
Amphetamine Screen, Urine: NEGATIVE
Barbiturate Screen, Ur: NEGATIVE
Benzodiazepine Screen, Urine: NEGATIVE
Cannabinoid Scrn, Ur: NEGATIVE
Cocaine Screen Urine: NEGATIVE
Fentanyl, Ur: NEGATIVE
Opiate Screen, Urine: NEGATIVE

## 2022-11-06 LAB — TROPONIN
Troponin, High Sensitivity: 87 ng/L (ref 0–14)
Troponin, High Sensitivity: 83 ng/L (ref 0–14)
Troponin, High Sensitivity: 76 ng/L (ref 0–14)

## 2022-11-06 LAB — CK: Total CK: 791 U/L — ABNORMAL HIGH (ref 20–180)

## 2022-11-06 MED ORDER — METRONIDAZOLE 500 MG/100ML IV SOLN
500 | Freq: Three times a day (TID) | INTRAVENOUS | Status: DC
Start: 2022-11-06 — End: 2022-11-06

## 2022-11-06 MED ORDER — SENNOSIDES 8.6 MG PO TABS
8.6 MG | Freq: Every day | ORAL | Status: DC | PRN
Start: 2022-11-06 — End: 2022-11-11

## 2022-11-06 MED ORDER — ASPIRIN 81 MG PO CHEW
81 MG | Freq: Once | ORAL | Status: AC
Start: 2022-11-06 — End: 2022-11-07

## 2022-11-06 MED ORDER — ACETAMINOPHEN 325 MG PO TABS
325 MG | Freq: Four times a day (QID) | ORAL | Status: DC | PRN
Start: 2022-11-06 — End: 2022-11-11
  Administered 2022-11-09: 22:00:00 650 mg via ORAL

## 2022-11-06 MED ORDER — BISACODYL 5 MG PO TBEC
5 MG | Freq: Every day | ORAL | Status: AC | PRN
Start: 2022-11-06 — End: 2022-11-12

## 2022-11-06 MED ORDER — METRONIDAZOLE 500 MG/100ML IV SOLN
500 | Freq: Once | INTRAVENOUS | Status: AC
Start: 2022-11-06 — End: 2022-11-06
  Administered 2022-11-06: 18:00:00 500 mg via INTRAVENOUS

## 2022-11-06 MED ORDER — ACETAMINOPHEN 650 MG RE SUPP
650 MG | Freq: Four times a day (QID) | RECTAL | Status: DC | PRN
Start: 2022-11-06 — End: 2022-11-11

## 2022-11-06 MED ORDER — ONDANSETRON 4 MG PO TBDP
4 MG | Freq: Four times a day (QID) | ORAL | Status: DC | PRN
Start: 2022-11-06 — End: 2022-11-11

## 2022-11-06 MED ORDER — CEFEPIME HCL 2 G IV SOLR
2 | Freq: Once | INTRAVENOUS | Status: AC
Start: 2022-11-06 — End: 2022-11-06
  Administered 2022-11-06: 17:00:00 2000 mg via INTRAVENOUS

## 2022-11-06 MED ORDER — ALUM & MAG HYDROXIDE-SIMETH 200-200-20 MG/5ML PO SUSP
200-200-205 MG/5ML | Freq: Four times a day (QID) | ORAL | Status: DC | PRN
Start: 2022-11-06 — End: 2022-11-11

## 2022-11-06 MED ORDER — ASPIRIN 300 MG RE SUPP
300 MG | Freq: Every day | RECTAL | Status: AC
Start: 2022-11-06 — End: 2022-11-08
  Administered 2022-11-06 – 2022-11-07 (×2): 300 mg via RECTAL

## 2022-11-06 MED ORDER — TUBERCULIN PPD 5 UNIT/0.1ML ID SOLN (NO DURATION)
50.1 UNIT/0.1ML | Freq: Once | INTRADERMAL | Status: AC
Start: 2022-11-06 — End: 2022-11-08
  Administered 2022-11-07: 17:00:00 5 [IU] via INTRADERMAL

## 2022-11-06 MED ORDER — NORMAL SALINE FLUSH 0.9 % IV SOLN
0.9 % | Freq: Two times a day (BID) | INTRAVENOUS | Status: AC
Start: 2022-11-06 — End: 2022-11-12
  Administered 2022-11-07 – 2022-11-10 (×5): 10 mL via INTRAVENOUS
  Administered 2022-11-10: 01:00:00 30 mL via INTRAVENOUS
  Administered 2022-11-11 – 2022-11-12 (×3): 10 mL via INTRAVENOUS

## 2022-11-06 MED ORDER — SODIUM CHLORIDE 0.9 % IV BOLUS
0.9 | Freq: Once | INTRAVENOUS | Status: AC
Start: 2022-11-06 — End: 2022-11-06
  Administered 2022-11-06: 17:00:00 2640 mL/kg via INTRAVENOUS

## 2022-11-06 MED ORDER — PIPERACILLIN SOD-TAZOBACTAM SO 3.375 (3-0.375) G IV SOLR
3.3753-0.375 (3-0.375) g | Freq: Two times a day (BID) | INTRAVENOUS | Status: AC
Start: 2022-11-06 — End: 2022-11-13
  Administered 2022-11-07 – 2022-11-09 (×6): 3375 mg via INTRAVENOUS

## 2022-11-06 MED ORDER — GADOTERIDOL 279.3 MG/ML IV SOLN
279.3 | Freq: Once | INTRAVENOUS | Status: AC | PRN
Start: 2022-11-06 — End: 2022-11-06
  Administered 2022-11-06: 21:00:00 20 mL via INTRAVENOUS

## 2022-11-06 MED ORDER — NOREPINEPHRINE-SODIUM CHLORIDE 4-0.9 MG/250ML-% IV SOLN
4-0.9250- MG/250ML-% | INTRAVENOUS | Status: AC
Start: 2022-11-06 — End: 2022-11-09
  Administered 2022-11-07: 01:00:00 5 ug/min via INTRAVENOUS

## 2022-11-06 MED ORDER — HEPARIN SODIUM (PORCINE) 5000 UNIT/ML IJ SOLN
5000 UNIT/ML | Freq: Three times a day (TID) | INTRAMUSCULAR | Status: DC
Start: 2022-11-06 — End: 2022-11-11
  Administered 2022-11-07 – 2022-11-11 (×12): 5000 [IU] via SUBCUTANEOUS

## 2022-11-06 MED ORDER — ONDANSETRON HCL 4 MG/2ML IJ SOLN
42 MG/2ML | INTRAMUSCULAR | Status: DC | PRN
Start: 2022-11-06 — End: 2022-11-11

## 2022-11-06 MED ORDER — LACTATED RINGERS IV BOLUS
Freq: Once | INTRAVENOUS | Status: AC
Start: 2022-11-06 — End: 2022-11-07

## 2022-11-06 MED ORDER — NORMAL SALINE FLUSH 0.9 % IV SOLN
0.9 % | INTRAVENOUS | Status: AC | PRN
Start: 2022-11-06 — End: 2022-11-12
  Administered 2022-11-10 (×2): 10 mL via INTRAVENOUS
  Administered 2022-11-11: 21:00:00 30 mL via INTRAVENOUS
  Administered 2022-11-12: 14:00:00 10 mL via INTRAVENOUS

## 2022-11-06 MED ORDER — CEFEPIME HCL 2 G IV SOLR
2 | INTRAVENOUS | Status: DC
Start: 2022-11-06 — End: 2022-11-06

## 2022-11-06 MED ORDER — SODIUM CHLORIDE 0.9 % IV SOLN
0.9 % | INTRAVENOUS | Status: AC | PRN
Start: 2022-11-06 — End: 2022-11-12

## 2022-11-06 MED ORDER — LACTATED RINGERS IV SOLN
INTRAVENOUS | Status: AC
Start: 2022-11-06 — End: 2022-11-07

## 2022-11-06 MED FILL — ASPIRIN 81 MG PO CHEW: 81 MG | ORAL | Qty: 4

## 2022-11-06 MED FILL — METRONIDAZOLE 500 MG/100ML IV SOLN: 500 MG/100ML | INTRAVENOUS | Qty: 100

## 2022-11-06 MED FILL — TUBERCULIN PPD 5 UNIT/0.1ML ID SOLN: 5 UNIT/0.1ML | INTRADERMAL | Qty: 0.1

## 2022-11-06 MED FILL — CEFEPIME HCL 2 G IV SOLR: 2 g | INTRAVENOUS | Qty: 2

## 2022-11-06 MED FILL — ASPIRIN 300 MG RE SUPP: 300 MG | RECTAL | Qty: 1

## 2022-11-06 NOTE — Procedures (Signed)
 Central venous catheter placement    Consent: Informed consent obtained prior to the procedure. Please see consent form located in the patient's chart.    Indication: Septic shock    Location: CVICU   Procedural Sedation: Lidocaine  50 mg was given for local anesthetic at the insertion site.    Technique Sterile precautions were observed throughout per protocol. A right internal jugular central venous catheter was placed using ultrasound guidance via Seldinger technique on the first attempt without complications. Proper venous cannulation was confirmed via transducing the wire inside the right internal jugular vein prior to dilation and insertion of the catheter. The patient tolerated the procedure well. A portable chest x-ray is pending to confirm placement and rule out complications.    Post-Procedure Exam: Stable. Equal breath sounds.    Complications: None noted. Estimated blood loss less than 5 mL.

## 2022-11-06 NOTE — Significant Event (Incomplete)
 RRT paged at 1642 to see new admission from the ED for AMS/2nd opinion/hypotension/increased oxygen needs.  At time of arrival, pt is in slight trendelenburg position dt hypotension.  SBP 60-70's. Dr. Floretta to bedside promptly, and order received to transfer pt to SICU. Placed on 6 LO2 and sat remained 88%. Placed on 100% NRB mask. Sat increased to upper 90's.  Bolus paused dt likely fluid overload. Stat PCXR ordered.     CVICU bed assigned. Pt transferred immediately to 5003. Call ended at 1715.

## 2022-11-06 NOTE — H&P (Addendum)
 [facility] Hospitalist Service     History & Physical       CHIEF COMPLAINT:  Altered Mental Status (Pt is brought by ems from home. Pt lives by herself and per ems she has been on the floor for at least 3 days. Pt has R sided facial droop, R sided weakness and slurred speech. Family tried to call her 3 days ago but hasn't heard anything. Pt soiled in her feces. )       Reason for Admission:  Altered mental status     History of Present Illness   The patient is a 87 y.o. female with significant past medical history of aortic valve stenosis, left carotid stenosis found down on the floor of her apartment after family did not hear from her.  The patient is improving and able to provide some history she believes that she has been on floor since 3 /1.  She reports that she fell and tries to explain why but her speech is decipherable.  She reports that she was too weak to get up.  Per her son this could be the case given the last time they talk to her.  She was found by someone in her apartment complex.  At this time she denies chest pain.  She reports that she hurts all over but mostly in her back.  She denies previous stroke.  Her son reports that she mainly keeps her medical history to herself and she tells them that she has been healthy.  Reviewing the records it looks like she has had several missed appointments as an outpatient and was supposed to have carotid ultrasound as well as an echo recently that she missed.  Past Medical History    has a past medical history of spinal cord injury and Leaking of urine.  Left carotid stenosis, aortic valve stenosis    Past Surgical History     Past Surgical History:   Procedure Laterality Date    LITHOTRIPSY      TONSILLECTOMY AND ADENOIDECTOMY          Medications Prior to Admission   Not in a hospital admission.    Allergies:    Patient has no known allergies.    Social History     Patient lives alone, she does not smoke or drink  Patient uses a walker  Patient has 2 children,  Alm who is in route from [place] and a daughter that is in [place]          Family History     Family History   Problem Relation Age of Onset    Diabetes Mother     No Known Problems Father     Cancer Brother         Throat Cancer    Alzheimer's Disease Brother     Diabetes Paternal Grandfather         Review of Systems   As per HPI and the rest of a 14 point ROS is negative    Physical Exam   Vitals:  BP (!) 134/52   Pulse 94   Temp (!) 96.4 F (35.8 C)   Resp 26   Ht 1.727 m (5' 8)   Wt 88 kg (194 lb 0.1 oz)   SpO2 93%   BMI 29.50 kg/m  Body mass index is 29.5 kg/m.   General: Disheveled, chronically ill-appearing   eye: PERRL, EOMI  HENT: Right facial droop very dry mucous membranes, multiple caries and darkened, black teeth  Lungs: Weak cough with secretions noted  heart: Normal rate, regular rhythm, no murmur, gallop or edema.  Abdomen: Soft, non-tender, non-distended, normal bowel sounds, no masses.  Musculoskeletal: Right upper and lower extremity weakness, unable to do full exam  Skin: Skin is warm, dry and pink, no rashes or lesions.  Neurologic: Awake, alert, and oriented X3, CN II-XII intact.  Right upper and lower extremity weakness  Psychiatripc: Calm  LABS:  Lab Results   Component Value Date/Time    NA 145 11/06/2022 11:49 AM    K 4.0 11/06/2022 11:49 AM    CL 101 11/06/2022 11:49 AM    CO2 23 11/06/2022 11:49 AM    BUN 182 11/06/2022 11:49 AM    CREATININE 2.8 11/06/2022 11:49 AM    GLUCOSE 161 11/06/2022 11:49 AM    CALCIUM  9.5 11/06/2022 11:49 AM     Lab Results   Component Value Date/Time    CKTOTAL 791 11/06/2022 11:49 AM     Lab Results   Component Value Date/Time    WBC 21.7 11/06/2022 11:49 AM    HGB 14.3 11/06/2022 11:49 AM    HCT 44.7 11/06/2022 11:49 AM    MCV 90.1 11/06/2022 11:49 AM    PLT 280 11/06/2022 11:49 AM       IMAGING:  CT ABDOMEN PELVIS WO CONTRAST Additional Contrast? None    Result Date: 11/06/2022  1. No definite acute findings by noncontrast technique.  2. 9 mm  nonobstructing right kidney stone. 2 mm stone urinary bladder. Urinary bladder decompressed by Foley catheter. 3. Pelvic floor relaxation. 4. Bones appear osteopenic. Multilevel compression deformities lumbar spine and at T12 are technically age-indeterminate but favored to be chronic. Correlate for point tenderness.    CT Head W/O Contrast    Result Date: 11/06/2022  No acute intracranial abnormality.   Chronic findings as above.    XR CHEST PORTABLE    Result Date: 11/06/2022  Consolidative opacities in the right midlung and left lung base. Findings could reflect pneumonia in the appropriate clinical context, however underlying nodule  not excluded. At a minimum, recommend short-term follow-up two-view chest radiograph in 4-6 weeks for further evaluation.     No results found for this or any previous visit.       Assessment and Plan   #Altered mental status, found down  #Metabolic encephalopathy  -May have been down as long as 6 days  - Unclear if fall was caused by a stroke or if patient may subsequently had a stroke while lying on the ground  -Mental status seems to be improving with fluids but does still appear to have right facial droop and right sided weakness    #Right facial droop, right-sided weakness, Dysphagia  -Suspect CVA  - Known left carotid stenosis  - MRI, MRA head and neck  - Continue aspirin   - Echo  - Neuro consult  - Fasting lipid panel  -Speech therapy, PT, OT, PPD    #Aspiration pneumonia with severe sepsis  #Hypothermia  #Lactic acidosis  #Leukocytosis  -Bear hugger  -Blood cultures sent  - Patient has been given a 30 mg/kg bolus  -Continue cefepime  and Flagyl  started in the ER  - Needs follow-up imaging as radiology reported they could not exclude underlying nodule    #AKI, mild rhabdomyolysis  - IV fluids  - Monitor renal function carefully  -Consider additional workup with renal function does not resolve with fluids    #Sacral wound, present on admission  - Due  to prolonged downtime  - Wound  care consult    #Elevated troponin, aortic valve stenosis  - Likely stress-induced ischemia  - Echo with history of aortic valve stenosis    #Chronic compression fracture  - Patient with chronic back pain and some debility  - PT, OT    DVT prophylaxis:[]  Lovenox                                  [x]  Subcut heparin                                   []  Already on Anticoagulation                                  []  SCDs          []  Encourage ambulation, low risk for DVT   Dispo: Admit for further workup  Code status: No Order   Medical Decision maker: Extended Emergency Contact Information  Primary Emergency Contact: Mandarino,David  Mobile Phone: [phone]  Relation: Child  Preferred language: English  Interpreter needed? No  Secondary Emergency Contact: Mandarino,Jennifer  Mobile Phone: [phone]  Relation: Child  Preferred language: English  Interpreter needed? No   PCP: Claudene Carlin CROME, MD     For any questions, please contact Team J    Addendum:  Shortly after patient arrived on the floor the nurse called me as patient was hypotensive and hypoxic.  I proceeded to the floor to see the patient and she was in significant respiratory distress with sats into the 80s with much more labored breathing than previous.  In addition her blood pressures were as low as systolics in the 60s.  Patient mental status appeared about the same but her respiratory status was significantly worse with increased work of breathing and signs of fluid overload.  Unfortunately patient cannot tolerate a diuretic given her blood pressure when she cannot tolerate fluids due to likely fluid overload.  Spoke to Dr. Marcey Crete in the ICU for transfer there for further treatment.  In addition I changed the echo that was already planned to a stat study.    Critical care time 45 minutes

## 2022-11-06 NOTE — Care Coordination (Addendum)
 11/06/22 1537   Service Assessment   Patient Orientation Unable to Assess  (AMS)   Cognition Other (see comment)  (AMS)   History Provided By Medical Record   Primary Caregiver Self   Support Systems Children   PCP Verified by CM Yes  (Dr. Carlin Sharps)   Prior Functional Level Independent in ADLs/IADLs   Current Functional Level Assistance with the following:   Can patient return to prior living arrangement Unknown at present   Ability to make needs known: Unable   Would you like for me to discuss the discharge plan with any other family members/significant others, and if so, who?   (Unable to ask pt at this time)   Chief Executive Officer;Other (Comment)  ([employer])   Social/Functional History   Lives With Alone   Type of Home Apartment   Home Equipment Walker, rolling   ADL Assistance Independent   Active Driver Yes   Discharge Planning   Type of Residence Apartment   Living Arrangements Alone   Potential Assistance Needed Durable Medical Equipment;Home Care;Skilled Nursing Facility;Transportation   Patient expects to be discharged to: Unknown   Services At/After Discharge   Transition of Care Consult (CM Consult) Discharge Planning   [employer] Information Provided? No   Mode of Transport at Discharge Other (see comment)  (To be determined)   Condition of Participation: Discharge Planning   The Plan for Transition of Care is related to the following treatment goals: Pending clinical course     11/06/22  CSC:  Pt unable to participate in assessment at this time.  Chart screening completed. Pt resides alone at [facility].  She ambulates with a walker.  She was found down by the apartment manager after family could not reach her for several days.  Pt's PCP is Dr. Sharps.  Pt no-showed for her PCP appointment on 10/16/22.  Per H&P, pt's son is en route from [place].  Pt's daughter lives in [place] .  Pt has used [facility] in the past. Consults placed for Neurology, Wound  Care, and PT/OT/SLP.  PPD has been ordered in case SNF is needed.  Disposition pending clinical course.  CM will continue to follow.

## 2022-11-06 NOTE — Consults (Signed)
 Critical Care Consult Note            Date:11/06/2022        Patient Name:Ashley Davies     Date of Birth:11-16-1935     Age:87 y.o.    Reason for Consult:  Critical Care Management    Chief Complaint     Chief Complaint   Patient presents with    Altered Mental Status     Pt is brought by ems from home. Pt lives by herself and per ems she has been on the floor for at least 3 days. Pt has R sided facial droop, R sided weakness and slurred speech. Family tried to call her 3 days ago but hasn't heard anything. Pt soiled in her feces.            History Obtained From   electronic medical record due to garbled speech    History of Present Illness    87 y.o. female with significant past medical history of aortic valve stenosis, left carotid stenosis found down on the floor of her apartment after family did not hear from her.  The patient was able to provide some history she believes that she has been on floor since 3 /1.  She reports that she fell and tries to explain why but her speech is indecipherable.  She reports that she was too weak to get up.  Per her son this could be the case given the last time they talk to her.  She was found by someone in her apartment complex.  She denied chest pain.  She reported that she hurts all over but mostly in her back.  She denies previous stroke.  Her son reports that she mainly keeps her medical history to herself and she tells them that she has been healthy.  Reviewing the records it looks like she has had several missed appointments as an outpatient and was supposed to have carotid ultrasound as well as an echo recently that she missed.     She was admitted to the Hospitalist service. She received 30ml/kg IVF per sepsis protocol. CT abd pelvis negative except for 9 mm nonobstructing kidney stone. Due to right sided weakness and garbled speech she underwent CT head which was negative. MRI brain revealed No mass or shift of midline structure. 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 Small acute infarct anterior inferior medial right cerebellum, 7 mm. Small acute/subacute lacunar infarct left frontal white matter. Severe underlying small vessel change. Generalized atrophy with   prominent temporal lobe involvement.  The major intracranial vessels are patent. RRT was called when patient returned from MRI. She was hypersomnolent and hypotensive. She has been transferred to CVICU. On arrival, patient is awake, attempts to converse, but speech is difficult to understand. She has right facial droop and right sided weakness.     Past Medical History     Past Medical History:   Diagnosis Date    Hx of spinal cord injury     Leaking of urine         Past Surgical History     Past Surgical History:   Procedure Laterality Date    LITHOTRIPSY      TONSILLECTOMY AND ADENOIDECTOMY          Medications     Prior to Admission medications    Medication Sig Start Date End Date Taking? Authorizing Provider   Mirabegron (MYRBETRIQ PO) Take by mouth    [provider]   psyllium 0.52 g capsule Take 2 capsules by mouth in the morning and at bedtime 02/11/22   Delice Penne SQUIBB, APRN - NP        aspirin  chewable tablet 324 mg, Once  tuberculin injection 5 Units, Once  [START ON 11/07/2022] ceFEPIme  (MAXIPIME ) 2,000 mg in sodium chloride  0.9 % 100 mL IVPB (mini-bag), Q24H  metroNIDAZOLE  (FLAGYL ) 500 mg in 0.9% NaCl 100 mL IVPB premix, Q8H  aspirin  suppository 300 mg, Daily  lactated ringers  IV soln infusion, Continuous  sodium chloride  flush 0.9 % injection 5-40 mL, 2 times per day  sodium chloride  flush 0.9 % injection 5-40 mL, PRN  0.9 % sodium chloride  infusion, PRN  ondansetron  (ZOFRAN -ODT) disintegrating tablet 4 mg, Q6H PRN   Or  ondansetron  (ZOFRAN ) injection 4 mg, Q4H PRN  bisacodyl  (DULCOLAX) EC tablet 5 mg, Daily PRN  senna (SENOKOT) tablet 8.6 mg, Daily PRN  aluminum & magnesium hydroxide-simethicone (MAALOX) 200-200-20 MG/5ML suspension 30 mL, Q6H PRN  acetaminophen   (TYLENOL ) tablet 650 mg, Q6H PRN   Or  acetaminophen  (TYLENOL ) suppository 650 mg, Q6H PRN  heparin  (porcine) injection 5,000 Units, 3 times per day  lactated ringers  bolus 500 mL, Once        Allergies   Patient has no known allergies.    Social History     Social History       Tobacco History       Smoking Status  Never      Smokeless Tobacco Use  Never              Alcohol History       Alcohol Use Status  Never              Drug Use       Drug Use Status  Never              Sexual Activity       Sexually Active  Not Currently Partners  Female                    Family History     Family History   Problem Relation Age of Onset    Diabetes Mother     No Known Problems Father     Cancer Brother         Throat Cancer    Alzheimer's Disease Brother     Diabetes Paternal Grandfather        Review of Systems   Limited by patient's speech being garbled  She denies CP, admits to SOB. Denies n/v or abdominal pain    Physical Exam   BP (!) 69/44   Pulse 92   Temp 96.8 F (36 C)   Resp 17   Ht 1.727 m (5' 8)   Wt 88 kg (194 lb 0.1 oz)   SpO2 100%   BMI 29.50 kg/m     General: Alert, elderly WF wearing NRB mask.   Neck: supple  Lungs: Clear to auscultation, non-labored respirations, no wheezing  Heart: Normal rate, regular rhythm, 2-3/6 murmur  Musculoskeletal: Warm, well perfused, no joint swelling or tenderness  Abdomen: Soft, non-tender, non-distended, normal bowel sounds  Neurologic: right facial droop, right hemiplegia, garbled speech, able to follow commands with left side. Attempts to answer questions, but speech is difficult to understand.  Skin: sacral area with approx 10 cm x10 cm black eschar  Extremities: no cyanosis, no edema     Labs  CBC:  Recent Labs     11/06/22  1149   WBC 21.7*   RBC 4.96   HGB 14.3   HCT 44.7   MCV 90.1   RDW 14.0   PLT 280     CHEMISTRIES:  Recent Labs     11/06/22  1149   NA 145   K 4.0   CL 101   CO2 23   BUN 182*   CREATININE 2.8*   GLUCOSE 161*     PT/INR:No results for  input(s): PROTIME, INR in the last 72 hours.  APTT:No results for input(s): APTT in the last 72 hours.  LIVER PROFILE:  Recent Labs     11/06/22  1149   AST 87*   ALT 82*   BILITOT 1.02   ALKPHOS 106       Imaging/Diagnostics   MRA NECK W CONTRAST    Result Date: 11/06/2022  1. MRA neck: Limited by excessive motion artifact Right ICA, mild to moderate narrowing, likely not exceeding 30-40% Left ICA, mild narrowing at the origin, small vascular structure near the bifurcation,                Cannot exclude 5 mm ulcerated plaque Vertebral arteries are bilaterally patent, proximal segments, origins, as well as the origins of the other brachiocephalic vessels are not well visualized, cannot differentiate artifact from anatomic    MRI BRAIN WO CONTRAST    Result Date: 11/06/2022  1. MRI brain 14 mm acute perforator infarct left pons 7 mm acute infarct right anterior inferior cerebellum 4 mm acute/subacute lacunar infarct left frontal, periventricular white matter No hemorrhage, severe underlying small vessel change Generalized atrophy with prominent temporal lobe involvement 2. MRA brain No large vessel occlusion. Left ACA, severe stenosis Left M2, moderate stenosis Right P2, severe stenosis proximal and distal segment Left P2, severe stenosis mid segment    MRA HEAD WO CONTRAST    Result Date: 11/06/2022  1. MRI brain 14 mm acute perforator infarct left pons 7 mm acute infarct right anterior inferior cerebellum 4 mm acute/subacute lacunar infarct left frontal, periventricular white matter No hemorrhage, severe underlying small vessel change Generalized atrophy with prominent temporal lobe involvement 2. MRA brain No large vessel occlusion. Left ACA, severe stenosis Left M2, moderate stenosis Right P2, severe stenosis proximal and distal segment Left P2, severe stenosis mid segment    CT ABDOMEN PELVIS WO CONTRAST Additional Contrast? None    Result Date: 11/06/2022  1. No definite acute findings by noncontrast technique.   2. 9 mm nonobstructing right kidney stone. 2 mm stone urinary bladder. Urinary bladder decompressed by Foley catheter. 3. Pelvic floor relaxation. 4. Bones appear osteopenic. Multilevel compression deformities lumbar spine and at T12 are technically age-indeterminate but favored to be chronic. Correlate for point tenderness.    CT Head W/O Contrast    Result Date: 11/06/2022  No acute intracranial abnormality.   Chronic findings as above.    XR CHEST PORTABLE    Result Date: 11/06/2022  Consolidative opacities in the right midlung and left lung base. Findings could reflect pneumonia in the appropriate clinical context, however underlying nodule  not excluded. At a minimum, recommend short-term follow-up two-view chest radiograph in 4-6 weeks for further evaluation.       Assessment      Hospital Problems             Last Modified POA    * (Principal) Altered mental status 11/06/2022 Yes  Plan   Altered mental status, found down  Metabolic encephalopathy  -May have been down as long as 6 days  - Unclear if fall was caused by a stroke or if patient may subsequently had a stroke while lying on the ground  -Mental status seems to be improving with fluids but does still appear to have right facial droop and right sided weakness with garbled speech     Right facial droop, right-sided weakness, Dysphagia, possible CVA  - Known left carotid stenosis  - MRI, MRA head and neck   - Continue aspirin   - Echo  - Neuro consult  - Fasting lipid panel  - Speech therapy, PT, OT, PPD     Aspiration pneumonia with severe sepsis +/- hypovolemic shock  Hypothermia  Lactic acidosis  Leukocytosis  -Bair hugger  -Blood cultures sent  - Patient has been given a 30 mg/kg bolus  -Continue cefepime  and Flagyl  started in the ER, will change to Zosyn   - Needs follow-up imaging as radiology reported they could not exclude underlying nodule     AKI, mild rhabdomyolysis  - IV fluids  - Monitor renal function carefully  -Consider additional workup  with renal function does not resolve with fluids  - trend CPK and lactic acid     Sacral wound with eschar, present on admission  - approximately 10 x10 cm eschar in sacral area  - Due to prolonged downtime  - Wound care consult    Acute hypoxemic resp failure  -due to shock and presumed pna  -O2 as needed, tx underlying acute disease     Elevated troponin, aortic valve stenosis  - Likely stress-induced ischemia  - Echo with history of aortic valve stenosis     Chronic compression fracture  - Patient with chronic back pain and some debility  - PT, OT     35 minutes CC time   Dispo: Admit for further workup  Code status: No Order   Medical Decision maker: Extended Emergency Contact Information  Primary Emergency Contact: Mandarino,David, son.   Mobile Phone: [phone]  Relation: Child  Preferred language: English  Interpreter needed? No  Secondary Emergency Contact: Mandarino,Jennifer  Mobile Phone: [phone]  Relation: Child  Preferred language: English  Interpreter needed? No   PCP: Claudene Carlin CROME, MD     Electronically signed by Devere CHRISTELLA Hoard, PA on 11/06/22 at 5:36 PM EST

## 2022-11-06 NOTE — ED Provider Notes (Signed)
 [facility] DE[facility]  EMERGENCY DEPARTMENT ENCOUNTER      Pt Name: Ashley Davies  MRN: [id number]  Birthdate May 26, 1936  Date of evaluation: 11/06/2022  Provider evaluation time: 11/06/22 1121  Provider: Burnard Almarie Orts, PA-C    CHIEF COMPLAINT       Chief Complaint   Patient presents with    Altered Mental Status     Pt is brought by ems from home. Pt lives by herself and per ems she has been on the floor for at least 3 days. Pt has R sided facial droop, R sided weakness and slurred speech. Family tried to call her 3 days ago but hasn't heard anything. Pt soiled in her feces.        HISTORY OF PRESENT ILLNESS    HPI    87 year old female with history of carotid stenosis and chronic back pain presents to the ED with concern for altered mental status.  Patient is unable to provide any history herself, but reportedly she lives alone in her apartment and the last time family talked to her was approximately 5 days ago.  3 days ago when they called she did not answer, and they have not been able to contact her since then, so the manager of the apartment complex checked on her and found her on the ground.  She appeared to have been in the same position for a long time as she was surrounded in her own urine and feces.    Nursing Notes were reviewed.  REVIEW OF SYSTEMS     Review of Systems   Unable to perform ROS: Mental status change   All other systems reviewed and are negative.    Except as noted above the remainder of the review of systems was reviewed and negative.     PAST MEDICAL HISTORY     Past Medical History:   Diagnosis Date    Hx of spinal cord injury     Leaking of urine      SURGICAL HISTORY       Past Surgical History:   Procedure Laterality Date    LITHOTRIPSY      TONSILLECTOMY AND ADENOIDECTOMY       CURRENT MEDICATIONS       Previous Medications    MIRABEGRON (MYRBETRIQ PO)    Take by mouth    PSYLLIUM 0.52 G CAPSULE    Take 2 capsules by mouth in the morning and at bedtime     ALLERGIES     Patient  has no known allergies.  FAMILY HISTORY       Family History   Problem Relation Age of Onset    Diabetes Mother     No Known Problems Father     Cancer Brother         Throat Cancer    Alzheimer's Disease Brother     Diabetes Paternal Grandfather       SOCIAL HISTORY       Social History     Socioeconomic History    Marital status: Divorced     Spouse name: None    Number of children: None    Years of education: None    Highest education level: None   Tobacco Use    Smoking status: Never    Smokeless tobacco: Never   Substance and Sexual Activity    Alcohol use: Never    Drug use: Never    Sexual activity: Not Currently     Partners: Male  SCREENINGS   NIH Stroke Scale  Interval: Baseline  Level of Consciousness (1a): Alert  LOC Questions (1b): Answers neither question correctly  LOC Commands (1c): Performs neither task correctly  Best Gaze (2): Normal  Visual (3): No visual loss  Facial Palsy (4): (!) Minor paralysis  Motor Arm, Left (5a): No drift  Motor Arm, Right (5b): No movement  Motor Leg, Left (6a): Drift, but does not hit bed  Motor Leg, Right (6b): No movement  Limb Ataxia (7): (!) Present in two limbs  Sensory (8): (!) Mild to Moderate  Best Language (9): Severe aphasia  Dysarthria (10): Severe, unintelligible slurring or mute  Extinction and Inattention (11): No abnormality  Total: 21 Glasgow Coma Scale  Eye Opening: Spontaneous  Best Verbal Response: Inappropriate words  Best Motor Response: Obeys commands  Glasgow Coma Scale Score: 13       CIWA Assessment  BP: (!) 126/54  Pulse: 94         PHYSICAL EXAM       ED Triage Vitals [11/06/22 1122]   BP Temp Temp src Pulse Respirations SpO2 Height Weight - Scale   139/73 -- -- 94 20 -- 1.727 m (5' 8) 88 kg (194 lb 0.1 oz)       Physical Exam  Vitals and nursing note reviewed.   Constitutional:       Appearance: Normal appearance.      Comments: Patient is covered in feces and urine   HENT:      Head: Normocephalic and atraumatic.   Eyes:      Extraocular  Movements: Extraocular movements intact.      Pupils: Pupils are equal, round, and reactive to light.   Cardiovascular:      Rate and Rhythm: Normal rate and regular rhythm.      Pulses: Normal pulses.      Heart sounds: No murmur heard.     No friction rub. No gallop.   Pulmonary:      Effort: Pulmonary effort is normal.      Breath sounds: Normal breath sounds.   Abdominal:      Palpations: Abdomen is soft.      Tenderness: There is no abdominal tenderness.   Musculoskeletal:         General: No tenderness or deformity.      Cervical back: Normal range of motion and neck supple.   Skin:     Comments: Large blister/eschar in the sacral region.   Neurological:      General: No focal deficit present.      Mental Status: She is alert. She is disoriented and confused.   Psychiatric:         Mood and Affect: Mood normal.         PROCEDURES:  Unless otherwise noted below, none     Procedures  DIAGNOSTIC RESULTS   EKG: All EKG's are interpreted by the Emergency Department Physician who either signs or Co-signs this chart in the absence of a cardiologist.    RADIOLOGY:   Non-plain film images such as CT, Ultrasound and MRI are read by the radiologist. Plain radiographic images are visualized and preliminarily interpreted by the emergency physician with the below findings:    Interpretation per the Radiologist below, if available at the time of this note:  CT Head W/O Contrast   Final Result   No acute intracranial abnormality.   Chronic findings as above.      CT ABDOMEN PELVIS WO CONTRAST  Additional Contrast? None   Final Result      1. No definite acute findings by noncontrast technique.     2. 9 mm nonobstructing right kidney stone. 2 mm stone urinary bladder. Urinary    bladder decompressed by Foley catheter.   3. Pelvic floor relaxation.   4. Bones appear osteopenic. Multilevel compression deformities lumbar spine and    at T12 are technically age-indeterminate but favored to be chronic. Correlate    for point  tenderness.      XR CHEST PORTABLE   Final Result      Consolidative opacities in the right midlung and left lung base. Findings could    reflect pneumonia in the appropriate clinical context, however underlying nodule    not excluded. At a minimum, recommend short-term follow-up two-view chest    radiograph in 4-6 weeks for further evaluation.               MRI BRAIN WO CONTRAST    (Results Pending)   MRA HEAD WO CONTRAST    (Results Pending)   MRA NECK W WO CONTRAST    (Results Pending)       LABS:  Labs Reviewed   CBC WITH AUTO DIFFERENTIAL - Abnormal; Notable for the following components:       Result Value    WBC 21.7 (*)     Neutrophils % 85.6 (*)     Lymphocytes 2.6 (*)     Neutrophils Absolute 18.6 (*)     Absolute Lymph # 0.6 (*)     Absolute Mono # 2.4 (*)     Immature Grans (Abs) 0.09 (*)     All other components within normal limits   COMPREHENSIVE METABOLIC PANEL - Abnormal; Notable for the following components:    Glucose 161 (*)     BUN 182 (*)     Creatinine 2.8 (*)     Anion Gap 21 (*)     OSMOLALITY CALCULATED 347 (*)     Albumin 3.2 (*)     Albumin/Globulin Ratio 0.80 (*)     AST 87 (*)     ALT 82 (*)     Est, Glom Filt Rate 16 (*)     All other components within normal limits   LACTIC ACID - Abnormal; Notable for the following components:    Lactic Acid 3.3 (*)     All other components within normal limits   CK - Abnormal; Notable for the following components:    Total CK 791 (*)     All other components within normal limits   TROPONIN - Abnormal; Notable for the following components:    Troponin, High Sensitivity 87 (*)     All other components within normal limits   TROPONIN - Abnormal; Notable for the following components:    Troponin, High Sensitivity 83 (*)     All other components within normal limits   CULTURE, URINE   CULTURE, BLOOD 1   CULTURE, BLOOD 1   URINALYSIS W/ RFLX MICROSCOPIC   TSH   ETHANOL   URINE DRUG SCREEN   ADD ON LAB TEST   TROPONIN       All other labs were within normal  range or not returned as of this dictation.  EMERGENCY DEPARTMENT COURSE/REASSESSMENT and MDM:   Vitals:    Vitals:    11/06/22 1401 11/06/22 1402 11/06/22 1416 11/06/22 1419   BP:    (!) 126/54   Pulse:  Resp: 26  28 28    Temp: (!) 96.3 F (35.7 C)  (!) 96.3 F (35.7 C) (!) 96.3 F (35.7 C)   SpO2: 93% 94% 95% 94%   Weight:       Height:         ED Course:    ED Course as of 11/06/22 1452   Thu Nov 06, 2022   1155 EKG 12 Lead  Normal sinus rhythm, rate 89 bpm.  Left bundle branch block.      ECG interpreted by myself and Dr. Claudene. [KR]   1204 Notable leukocytosis with lactic acidosis noted.  I have elected to give the patient cefepime  with concern for potential infected traumatic wound to her lower back. [PS]   1235 Chest x-ray shows evidence of possible bilateral pneumonia with infiltrate in the right midlung field.  Given these findings I have elected to add on Flagyl  for for potential aspiration coverage [PS]   1442 I spoke w/ Dr. Floretta who will admit for further management. [PS]      ED Course User Index  [KR] Melda Burnard Norris, PA-C  [PS] Claudene Maude Mango, MD       Medical Decision Making  87 year old female with history of carotid stenosis and chronic back pain presents to the ED with concern for altered mental status.  On exam she is slightly hypothermic  but her vital signs are otherwise within normal limits.  Extremely unkempt, with lumbosacral wound, covered in feces and urine.  Workup shows evidence of likely pneumonia and significant dehydration.  Initiated treatment with 30 cc/kg IV fluids in addition to cefepime  and Flagyl .  Admitted to stepdown service.      Presentation, assessment, and plan discussed with and approved by attending physician Dr. Claudene.        Amount and/or Complexity of Data Reviewed  Labs: ordered.  Radiology: ordered.  ECG/medicine tests: ordered. Decision-making details documented in ED Course.    Risk  OTC drugs.  Prescription drug management.  Decision  regarding hospitalization.        CONSULTS:  IP WOUND CARE NURSE CONSULT TO EVAL  IP CONSULT TO NEUROLOGY    FINAL IMPRESSION      1. Stenosis of left carotid artery    2. Altered mental status, unspecified altered mental status type    3. Pneumonia of both lungs due to infectious organism, unspecified part of lung    4. AKI (acute kidney injury) (HCC)    5. Dehydration    6. Elevated troponin    7. Traumatic blister of back        DISPOSITION/PLAN   DISPOSITION Admitted 11/06/2022 02:45:21 PM      PATIENT REFERRED TO:  No follow-up provider specified.    DISCHARGE MEDICATIONS:  New Prescriptions    No medications on file       (Please note that portions of this note were completed with a voice recognition program.  Efforts were made to edit the dictations but occasionally words are mis-transcribed.)  Burnard Norris Melda, PA-C (electronically signed)  Attending Emergency Physician           Melda Burnard Norris, PA-C  11/06/22 1452

## 2022-11-07 ENCOUNTER — Inpatient Hospital Stay: Admit: 2022-11-07 | Payer: PRIVATE HEALTH INSURANCE | Primary: Internal Medicine

## 2022-11-07 LAB — COMPREHENSIVE METABOLIC PANEL
ALT: 58 U/L — ABNORMAL HIGH (ref 0–35)
AST: 69 U/L — ABNORMAL HIGH (ref 0–35)
Albumin/Globulin Ratio: 1 (ref 1.00–2.70)
Albumin: 2.5 g/dL — ABNORMAL LOW (ref 3.5–5.2)
Alk Phosphatase: 79 U/L (ref 35–117)
Anion Gap: 17 mmol/L (ref 2–17)
BUN: 169 mg/dL — ABNORMAL HIGH (ref 8–23)
CALCIUM,CORRECTED,CCA: 9.6 mg/dL (ref 8.8–10.2)
CO2: 20 mmol/L — ABNORMAL LOW (ref 22–29)
Calcium: 8.4 mg/dL — ABNORMAL LOW (ref 8.8–10.2)
Chloride: 110 mmol/L — ABNORMAL HIGH (ref 98–107)
Creatinine: 2.8 mg/dL — ABNORMAL HIGH (ref 0.5–1.0)
Est, Glom Filt Rate: 16 mL/min/1.73m — ABNORMAL LOW (ref >=60)
Globulin: 2.5 g/dL (ref 1.9–4.4)
Glucose: 167 mg/dL — ABNORMAL HIGH (ref 70–99)
OSMOLALITY CALCULATED: 349 mOsm/kg — ABNORMAL HIGH (ref 270–287)
Potassium: 3.8 mmol/L (ref 3.5–5.3)
Sodium: 147 mmol/L — ABNORMAL HIGH (ref 135–145)
Total Bilirubin: 0.67 mg/dL (ref 0.00–1.20)
Total Protein: 5 g/dL — ABNORMAL LOW (ref 6.4–8.3)

## 2022-11-07 LAB — CULTURE, BLOOD, PCR ID PANEL RESULTS REPORT
Acinetobacter calcoaceticus-baumannii complex: NOT DETECTED
Bacteroides fragilis: NOT DETECTED
Candida albicans: NOT DETECTED
Candida auris: NOT DETECTED
Candida glabrata: NOT DETECTED
Candida krusei: NOT DETECTED
Candida parapsilosis: NOT DETECTED
Candida tropicalis: NOT DETECTED
Cryptococcus neoformans/gattii: NOT DETECTED
Enterobacter cloacae complex: NOT DETECTED
Enterobacterales: NOT DETECTED
Enterococcus faecalis: NOT DETECTED
Enterococcus faecium: NOT DETECTED
Escherichia Coli: NOT DETECTED
Haemophilus influenzae: NOT DETECTED
Klebsiella aerogenes: NOT DETECTED
Klebsiella oxytoca: NOT DETECTED
Klebsiella pneumoniae group: NOT DETECTED
Listeria monocytogenes: NOT DETECTED
Neisseria meningitidis (encapsulated): NOT DETECTED
Proteus spp: NOT DETECTED
Pseudomonas aeruginosa: NOT DETECTED
Salmonella species: NOT DETECTED
Serratia marcescens: NOT DETECTED
Staphylococcus Aureus: NOT DETECTED
Staphylococcus epidermidis: NOT DETECTED
Staphylococcus lugdunensis: NOT DETECTED
Staphylococcus spp: NOT DETECTED
Stenotrophomonas maltophilia: NOT DETECTED
Streptococcus agalactiae (Group B): NOT DETECTED
Streptococcus pneumoniae: NOT DETECTED
Streptococcus pyogenes (Group A): NOT DETECTED
Streptococcus spp: NOT DETECTED

## 2022-11-07 LAB — ECHO (TTE) COMPLETE (PRN CONTRAST/BUBBLE/STRAIN/3D)
AR Max Velocity PISA: 3.4 m/s
AR PHT: 396.2 millisecond
AV Area by Peak Velocity: 1.5 cm2
AV Area by VTI: 1.6 cm2
AV Mean Gradient: 23 mmHg
AV Mean Velocity: 2.2 m/s
AV Peak Gradient: 39 mmHg
AV Peak Velocity: 3.1 m/s
AV VTI: 55.8 cm
AV Velocity Ratio: 0.45
AVA/BSA Peak Velocity: 0.7 cm2/m2
AVA/BSA VTI: 0.8 cm2/m2
Ao Root Index: 1.58 cm/m2
Aortic Root: 3.2 cm
Ascending Aorta Index: 1.53 cm/m2
Ascending Aorta: 3.1 cm
Body Surface Area: 2.05 m2
E/E' Lateral: 15.14
E/E' Ratio (Averaged): 16.4
EF BP: 62 % (ref 55–100)
Est. RA Pressure: 3 mmHg
Fractional Shortening 2D: 29 % (ref 28–44)
IVSd: 1.5 cm — AB (ref 0.6–0.9)
LA Diameter: 4.3 cm
LA Size Index: 2.13 cm/m2
LA Volume A-L A4C: 50 mL (ref 22–52)
LA Volume A-L A4C: 71 mL — AB (ref 22–52)
LA Volume A/L: 68 mL
LA Volume BP: 63 mL — AB (ref 22–52)
LA Volume Index A-L A2C: 25 mL/m2 (ref 16–34)
LA Volume Index A-L A4C: 35 mL/m2 — AB (ref 16–34)
LA Volume Index A/L: 34 mL/m2 (ref 16–34)
LA Volume Index BP: 31 ml/m2 (ref 16–34)
LA Volume Index MOD A2C: 22 ml/m2 (ref 16–34)
LA Volume Index MOD A4C: 33 ml/m2 (ref 16–34)
LA Volume MOD A2C: 45 mL (ref 22–52)
LA Volume MOD A4C: 67 mL — AB (ref 22–52)
LA/AO Root Ratio: 1.34
LV E' Lateral Velocity: 7 cm/s
LV E' Septal Velocity: 6 cm/s
LV EDV A2C: 61 mL
LV EDV A4C: 67 mL
LV EDV BP: 68 mL (ref 56–104)
LV EDV Index A2C: 30 mL/m2
LV EDV Index A4C: 33 mL/m2
LV EDV Index BP: 34 mL/m2
LV ESV A2C: 26 mL
LV ESV A4C: 23 mL
LV ESV BP: 26 mL (ref 19–49)
LV ESV Index A2C: 13 mL/m2
LV ESV Index A4C: 11 mL/m2
LV ESV Index BP: 13 mL/m2
LV Ejection Fraction A2C: 58 %
LV Ejection Fraction A4C: 66 %
LV Mass 2D Index: 132.6 g/m2 — AB (ref 43–95)
LV Mass 2D: 267.9 g — AB (ref 67–162)
LV RWT Ratio: 0.49
LVIDd Index: 2.43 cm/m2
LVIDd: 4.9 cm (ref 3.9–5.3)
LVIDs Index: 1.73 cm/m2
LVIDs: 3.5 cm
LVOT Area: 3.1 cm2
LVOT Diameter: 2 cm
LVOT Mean Gradient: 6 mmHg
LVOT Peak Gradient: 8 mmHg
LVOT Peak Velocity: 1.4 m/s
LVOT SV: 87.9 ml
LVOT Stroke Volume Index: 43.5 mL/m2
LVOT VTI: 28 cm
LVOT:AV VTI Index: 0.5
LVPWd: 1.2 cm — AB (ref 0.6–0.9)
MV A Velocity: 1.79 m/s
MV E Velocity: 1.06 m/s
MV E Wave Deceleration Time: 355.9 ms
MV E/A: 0.59
PR Max Velocity: 1.4 m/s
PV Max Velocity: 1.4 m/s
PV Peak Gradient: 8 mmHg
Pulmonary Artery EDP: 8 mmHg
RV Free Wall Peak S': 14 cm/s
RVSP: 25 mmHg
TAPSE: 1.9 cm (ref 1.7–?)
TR Max Velocity: 2.32 m/s
TR Peak Gradient: 22 mmHg

## 2022-11-07 LAB — CBC
Hematocrit: 36.2 % (ref 34.0–47.0)
Hematocrit: 35.3 % (ref 34.0–47.0)
Hemoglobin: 11.5 g/dL (ref 11.5–15.7)
Hemoglobin: 11.1 g/dL — ABNORMAL LOW (ref 11.5–15.7)
MCH: 29.3 pg (ref 27.0–34.5)
MCH: 29 pg (ref 27.0–34.5)
MCHC: 31.8 g/dL — ABNORMAL LOW (ref 32.0–36.0)
MCHC: 31.4 g/dL — ABNORMAL LOW (ref 32.0–36.0)
MCV: 92.1 fL (ref 81.0–99.0)
MCV: 92.2 fL (ref 81.0–99.0)
MPV: 11 fL (ref 7.2–13.2)
MPV: 11.3 fL (ref 7.2–13.2)
NRBC Absolute: 0 10*3/uL (ref 0.000–0.012)
NRBC Absolute: 0.02 10*3/uL — ABNORMAL HIGH (ref 0.000–0.012)
NRBC Automated: 0 % (ref 0.0–0.2)
NRBC Automated: 0.1 % (ref 0.0–0.2)
Platelets: 229 10*3/uL (ref 140–440)
Platelets: 246 10*3/uL (ref 140–440)
RBC: 3.93 x10e6/mcL (ref 3.60–5.20)
RBC: 3.83 x10e6/mcL (ref 3.60–5.20)
RDW: 14 % (ref 11.0–16.0)
RDW: 14.5 % (ref 11.0–16.0)
WBC: 20 10*3/uL — ABNORMAL HIGH (ref 3.8–10.6)
WBC: 14.9 10*3/uL — ABNORMAL HIGH (ref 3.8–10.6)

## 2022-11-07 LAB — COMPREHENSIVE METABOLIC PANEL W/ REFLEX TO MG FOR LOW K
ALT: 63 U/L — ABNORMAL HIGH (ref 0–35)
AST: 73 U/L — ABNORMAL HIGH (ref 0–35)
Albumin/Globulin Ratio: 1.1 (ref 1.00–2.70)
Albumin: 2.6 g/dL — ABNORMAL LOW (ref 3.5–5.2)
Alk Phosphatase: 80 U/L (ref 35–117)
Anion Gap: 15 mmol/L (ref 2–17)
BUN: 169 mg/dL — ABNORMAL HIGH (ref 8–23)
CALCIUM,CORRECTED,CCA: 9.3 mg/dL (ref 8.8–10.2)
CO2: 21 mmol/L — ABNORMAL LOW (ref 22–29)
Calcium: 8.2 mg/dL — ABNORMAL LOW (ref 8.8–10.2)
Chloride: 111 mmol/L — ABNORMAL HIGH (ref 98–107)
Creatinine: 2.5 mg/dL — ABNORMAL HIGH (ref 0.5–1.0)
Est, Glom Filt Rate: 18 mL/min/1.73m — ABNORMAL LOW (ref >=60)
Globulin: 2.4 g/dL (ref 1.9–4.4)
Glucose: 143 mg/dL — ABNORMAL HIGH (ref 70–99)
Potassium: 3.7 mmol/L (ref 3.5–5.3)
Sodium: 147 mmol/L — ABNORMAL HIGH (ref 135–145)
Total Bilirubin: 0.78 mg/dL (ref 0.00–1.20)
Total Protein: 5 g/dL — ABNORMAL LOW (ref 6.4–8.3)

## 2022-11-07 LAB — LACTIC ACID
Lactic Acid: 1.2 mmol/L (ref 0.5–2.0)
Lactic Acid: 1.3 mmol/L (ref 0.5–2.0)

## 2022-11-07 LAB — MAGNESIUM: Magnesium: 2.7 mg/dL — ABNORMAL HIGH (ref 1.6–2.6)

## 2022-11-07 LAB — CK
Total CK: 828 U/L — ABNORMAL HIGH (ref 20–180)
Total CK: 834 U/L — ABNORMAL HIGH (ref 20–180)
Total CK: 768 U/L — ABNORMAL HIGH (ref 20–180)

## 2022-11-07 MED ORDER — SODIUM CHLORIDE 0.45 % IV BOLUS
0.45 | Freq: Once | INTRAVENOUS | Status: AC
Start: 2022-11-07 — End: 2022-11-07
  Administered 2022-11-07: 14:00:00 1000 mL via INTRAVENOUS

## 2022-11-07 MED ORDER — LACTATED RINGERS IV SOLN
INTRAVENOUS | Status: AC
Start: 2022-11-07 — End: 2022-11-08
  Administered 2022-11-07 – 2022-11-08 (×3): via INTRAVENOUS

## 2022-11-07 MED ORDER — SULFUR HEXAFLUORIDE MICROSPH 60.7-25 MG IJ SUSR
Freq: Once | INTRAMUSCULAR | Status: AC | PRN
Start: 2022-11-07 — End: 2022-11-07
  Administered 2022-11-07: 16:00:00 5 mL via INTRAVENOUS

## 2022-11-07 MED ORDER — LIDOCAINE HCL URETHRAL/MUCOSAL 2 % EX PRSY
2 | Freq: Once | CUTANEOUS | Status: AC
Start: 2022-11-07 — End: 2022-11-07
  Administered 2022-11-07: 21:00:00 via TOPICAL

## 2022-11-07 MED ORDER — VANCOMYCIN HCL 1 G IV SOLR
1 | Freq: Once | INTRAVENOUS | Status: AC
Start: 2022-11-07 — End: 2022-11-07
  Administered 2022-11-07: 19:00:00 1500 mg via INTRAVENOUS

## 2022-11-07 MED FILL — PIPERACILLIN SOD-TAZOBACTAM SO 3.375 (3-0.375) G IV SOLR: 3.375 (3-0.375) g | INTRAVENOUS | Qty: 3375

## 2022-11-07 MED FILL — NORMAL SALINE FLUSH 0.9 % IV SOLN: 0.9 % | INTRAVENOUS | Qty: 40

## 2022-11-07 MED FILL — NOREPINEPHRINE-SODIUM CHLORIDE 4-0.9 MG/250ML-% IV SOLN: INTRAVENOUS | Qty: 250

## 2022-11-07 MED FILL — ASPIRIN 300 MG RE SUPP: 300 MG | RECTAL | Qty: 1

## 2022-11-07 MED FILL — HEPARIN SODIUM (PORCINE) 5000 UNIT/ML IJ SOLN: 5000 UNIT/ML | INTRAMUSCULAR | Qty: 1

## 2022-11-07 MED FILL — VANCOMYCIN HCL 1.5 G IV SOLR: 1.5 g | INTRAVENOUS | Qty: 1500

## 2022-11-07 MED FILL — GLYDO 2 % EX PRSY: 2 % | CUTANEOUS | Qty: 11

## 2022-11-07 NOTE — Progress Notes (Signed)
 Provided emotional and spiritual support for this kind lady. She was alert but could not speak. She could nod with her head. Pt's grand daughter was at bedside and was supportive.Pt's son arrived at the end of my visit.  Pt was in some distress this morning. I provided a less anxious non judgmental presence for her and her family. I comforted pt with words of hope and care. Provided a prayer for her care and wellbeing.  Will continue to follow with palliative team. There is concern for pt's trajectory.PC to continue supportive care.

## 2022-11-07 NOTE — Progress Notes (Signed)
 Acute Care Physical Therapy Evaluation   Inpatient   (PTA/PT Visit Days : 1)  Time In: 1028 Time Out: 1058 (30 minutes)  Acknowledge Orders  PT Charge Capture    Admitting Diagnosis:    Dehydration [E86.0]  Altered mental status [R41.82]  Elevated troponin [R79.89]  Stenosis of left carotid artery [I65.22]  AKI (acute kidney injury) (HCC) [N17.9]  Altered mental status, unspecified altered mental status type [R41.82]  Pneumonia of both lungs due to infectious organism, unspecified part of lung [J18.9]  Traumatic blister of back [S20.429A]      Reason for Referral: Generalized Muscle Weakness (M62.81)  Other abnormalities of gait and mobility (R26.89)  Payor: [employer] / Plan: [employer]ANS / Product Type: *No Product type* /   SUBJECTIVE   Ashley Davies is a 87 y.o. female admitted with Altered mental status. She  has a past medical history of Hx of spinal cord injury and Leaking of urine.  She also  has a past surgical history that includes Tonsillectomy and adenoidectomy and Lithotripsy.    Subjective: RN and pt in agreement with PT eval. Pt's family present during session. Pt reporting no pain at rest and increased pain to lower back/sacrum with mobility - unable to quantify when asked. RN aware.     Additional Pertinent History: Pt with hx of carotid stenosis and chronic back pain admitted 3/7 to the hospitalist service after being found on the ground for what's believed to be since 3/1 with hypothermia, lumbosacral wound, PNA, significant dehydration, AKI with mild rhabdomyolysis, metabolic encephalopathy, R facial droop/weakness, and chronic compression fracture. Imaging confirming acute perforator infarct left pons, acute infarct right anterior inferior cerebellum, acute/subacute lacunar infarct left frontal, periventricular white matter. She was later transferred to the ICU for continued care due to hypotension and hypoxemia.      Social Environment Prior Level of Function   Lives With:  Alone  Type of Home: Apartment  Home Layout: One level  Home Access: Level entry  Home Equipment: Walker, rolling  ADL Assistance: Independent  Homemaking Assistance: Independent  Ambulation Assistance: Independent  Transfer Assistance: Surveyor, Minerals: Yes  Leisure & Hobbies: reading   History of Falls: No    Comments: no additional assistance available per family      OBJECTIVE     Vital Signs/Pain Lines/Drains/Precautions   Vital Signs  Pulse: 89  Heart Rate Source: Monitor  BP: (!) 125/57  BP Location: Right upper arm (recieved with BP cuff on RUE)  BP Method: Automatic  Patient Position: Semi fowlers  MAP (Calculated): 80  SpO2: 100 %  O2 Device: Reservoir cannula  Comment: 7.5 L/min oxymizer. BP sitting EOB: 111/57 mm Hg and later 106/56 mm Hg. Pt asymptomtic    Pain Pain: no pain at rest - increased pain to buttocks with mobility. Unable to quantify  CVC Triple Lumen 11/06/22 Right Internal jugular (Active)       Peripheral IV 11/06/22 Left Antecubital (Active)       Peripheral IV 11/06/22 Left Forearm (Active)       Urinary Catheter 11/06/22 2 Way (Active)       Precautions/Restrictions  Restrictions/Precautions  Restrictions/Precautions: Fall Risk, Aspiration Risk  Position Activity Restriction  Other position/activity restrictions: R hemiparesis, lumbosacral wound - ensure appropriate positional techniques and mobility techniques to offload and ensure joint approximation; hx of compression fracture - spinal precautions for protection     Orientation/Cognition Vision/Hearing   Cognition Comment: able to follow single step  commands - increased time to initiate. Pt minimally verbal - able to answer consistently with Y/N via head nodding  Orientation Level: Oriented to place, Disoriented to time, Oriented to person  Vision: Impaired  Vision Exceptions: Wears glasses at all times  Hearing: Within functional limits      Objective Assessment  Gross Assessment   AROM: Grossly decreased,  non-functional  PROM: Grossly decreased, non-functional  Strength: Grossly decreased, non-functional (R UE with no AROM noted; trace hip flexion noted once to command; L hip flexion trace, L knee extension 2+/5 MMT, no other AROM noted to command this date. See OT eval for detailed assessment UE's)  Sensation: Impaired (unable to accurately assess - pt unable to detect sensation (LT) to any dermatome on either side of her body.)     FUNCTIONAL ACTIVITY     Bed Mobility   Bed Mobility Training: Yes  Rolling: Maximum assistance;Assist X2;Total assistance (Max A to the R with pt initiating LUE reach; total A to L)  Supine to Sit: Maximum assistance;Assist X2 (HOB elevated and cueing for sequencing. Pt attempting to initiate LUE reach and LE progression towards EOB with assist for completion.)  Sit to Supine: Total assistance;Assist X2 (assist for trunk and B LE management)  Scooting: Total assistance;Assist X2 (scooting up in bed post session)     Balance   Posture: Fair  Sitting - Static: Fair (pt tolerated static sitting balance EOB for ~ 5 min. Demo posterior lean initially requiring mod A. Able to progress briefly to SBA, with Min A to maintain upright balance. VC/TC to facilitate erect posture with upward gaze and pt with limited carry over)      Therapeutic Activity (15 Minutes) CPT 97530: Therapeutic activity included Rolling, Supine to Sit, Sit to Supine, Scooting, and Sitting balance  to improve functional Activity tolerance, Balance, and Mobility.    Co-Treatment PT/OT necessary due to patient's decreased overall endurance/tolerance levels, as well as need for high level skilled assistance to complete functional transfers/mobility and functional tasks  Safety: Type of Devices: All boney prominences offloaded;Nurse notified;Heels elevated for pressure relief;Patient at risk for falls;Left in bed;Call light within reach;Bed alarm in place;All fall risk precautions in place (B offloading boots donned, turned L,  CVICU monitoring intact, RUE elevated for GH approximation, no distress, RN aware)     Education: Education Given To: Patient;Family  Education Provided: Role of Therapy;Plan of Care;Precautions;Transfer Training;Orientation;Family Education;Equipment;Fall Prevention Strategies  Education Method: Verbal  Education Outcome: Continued education needed    [employer] AM-PACT "6 Clicks" Basic Mobility Inpatient Short Form   How much help is needed turning from your back to your side while in a flat bed without using bedrails?: Total  How much help is needed moving from lying on your back to sitting on the side of a flat bed without using bedrails?: Total  How much help is needed moving to and from a bed to a chair?: Total  How much help is needed standing up from a chair using your arms?: Total  How much help is needed walking in hospital room?: Total  How much help is needed climbing 3-5 steps with a railing?: Total  AM-PAC Inpatient Mobility Raw Score : 6  AM-PAC Inpatient T-Scale Score : 23.55  Mobility Inpatient CMS 0-100% Score: 100  Mobility Inpatient CMS G-Code Modifier : CN      ASSESSMENT   Assessment:  Pt 87 y/o female with hx of carotid stenosis and chronic back pain admitted 3/7 to the  hospitalist service after being found on the ground for what's believed to be since 3/1 with hypothermia, lumbosacral wound, PNA, significant dehydration, AKI with mild rhabdomyolysis, metabolic encephalopathy, R facial droop/weakness, and chronic compression fracture. Imaging confirming acute perforator infarct left pons, acute infarct right anterior inferior cerebellum, acute/subacute lacunar infarct left frontal, periventricular white matter. She was later transferred to the ICU for continued care due to hypotension and hypoxemia. Pt is now stabilized and cleared for initiation of skilled therapuetic intervention. Pt lives alone in a single level apartment with level entry and notes fully IND baseline. Upon eval, pt  was pleasant, though, presented with limited verbal responses and communication performed via head nodding using Y/N questions. Pt cooperative and following commands at this time, though, with delayed initiation requiring increased time and assistance. Regarding mobility, pt performed rolling with max/total A, sup <> sit with max Ax 2/total A x 2 and static sitting balance with mod A initially and progression to SBA/min A. Of note, pt with persistently lower BP the longer she sat EOB, though, declining any change in symptoms subjectively - will continue to monitor vitals to ensure safety in future sessions. Based on functional observation, pt presenting with gross weakness (no contraction noted to RUE and trace contraction to R hip flexion inconsistent + significant weakness to L extremities), gross deconditioning, impaired balance, impaired functional mobility, low back/sacral pain with mobility (ensure appropriate mobility and offloading techniques utilized 2T pain/skin breakdown), impaired postural control/endurance and she will likely continue to benefit from skilled IPPT to address deficits and maximzie safety at d/c. Next session, progress bed mobility, gross strengthening, static unsupported seated balance/postural control, and pre-transfer/transfer training as tolerated to identify least resistive technique. Pt may progress best if hoyered OOB to recliner to allow for more supportive position, though, will continue to assess. Additionally, may ultimately benefit from use of sling to RUE once able to complete transfers functionally, though, will continue to assess as pt progresses.      Evaluation Complexity: Medium Complexity      POST ACUTE RECOMMENDATIONS   Setting: Inpatient Rehabilitation Facility (IRF)    Justification for Recommended Setting: Recommended as patient is good candidate and motivated to obtain maximum functional independence. Pt would benefit from working with a specialized  multidisciplinary team and can tolerate therapy 3 hours a day 5 days a week.    Equipment: Equipment Needed: Yes (will need continued assessment)     Discharge Transportation Recommendations: Stretcher or requires skilled assist for transfer     PLAN   Frequency & Duration: 6 times/week for duration of hospital stay or until stated goals are met, whichever comes first.    Ms. Crespo presents with Good, Fair therapy prognosis. Skilled intervention is medically necessary to address the following performance deficits:Decreased functional mobility , Decreased ROM, Decreased ADL status, Decreased body mechanics, Decreased strength, Decreased safe awareness, Decreased endurance, Decreased sensation, Decreased balance, Decreased high-level IADLs, Decreased fine motor control, Decreased coordination, Increased pain, Decreased posture.  Benefits and precautions of physical therapy have been discussed with Ms. Adin, and the following interventions are recommended: Strengthening, ROM, Balance training, Functional mobility training, Transfer training, Endurance training, Wheelchair mobility training, Gait training, Neuromuscular re-education, Manual, Cognitive reorientation, Pain management, Home exercise program, Safety education & training, Patient/Caregiver education & training, Equipment evaluation, education, & procurement, Positioning, Therapeutic activities, Co-Treatment     Goals Patient Goals : none stated this date.  Short Term Goals Long Term Goals   Time Frame for Short Term Goals:  7 treatments  Short Term Goal 1: Pt will perform R rolling with mod A within 7 tx  Short Term Goal 2: Pt will perform L rolling with max A within 7 tx  Short Term Goal 3: Pt will perform sup <> sit with max A x 1 within 7 tx  Short Term Goal 4: Pt will perform transition from supported to unsupported short sitting (in the recliner) with mod A within 7 tx  Short Term Goal 5: Pt will perform static unsupported short sitting balance  activities with erect/midline posture and no LOB or distress x 10 min with PT SBA within 7 tx  Additional Goals?: Yes  Short Term Goal 6: Pt will perform static boosting with full buttocks clearance and mod A within 7 tx  Short Term Goal 7: Pt will perform boost + lateral scooting with full buttocks clearance and max a within 7 tx  Short Term Goal 8: Pt will perform level lateral transfer (with vs. without transfer board) with full buttocks clearance and max A within 7 tx  Short Term Goal 9: Pt will demo IND with B LE HEP/anti-embolics within 7 tx Time Frame for Long Term Goals : 14tx  Long Term Goal 1: Pt will perform R rolling with min A within 14 tx  Long Term Goal 2: Pt will perform L rolling with mod A within 7 tx  Long Term Goal 3: Pt will perform sup <> sit with mod A x 1 within 14 tx  Long Term Goal 4: Pt will perform static boosting with full buttocks clearance and min A within 14 tx  Long Term Goal 5: Pt will perform boost + lateral scooting with full buttocks clearance and mod a within 14 tx  Additional Goals?: Yes  Long term goal 6: Pt will perform level lateral transfer (with vs. without transfer board) with full buttocks clearance and mod A within 14 tx  Long term goal 7: Pt will perform sit <> stand with LRAD and mod A within 14tx  Long term goal 8: Pt will perform static standing balance with erect/midline posture and no LOB or distress x 3 min with PT min A within 14 tx  Long term goal 9: Pt will perform stand pivot with LRAD and max A within 14 tx      Therapist Signature: Josette Nat Mons, PT, DPT    Date: 11/07/2022

## 2022-11-07 NOTE — Consults (Signed)
 WOC consult for multiple areas of skin breakdown.  Patient admitted with AMS after being down and immobile after an unwitnessed fall at her home.  Presents with multiple areas of skin breakdown:    Unstageable sacral pressure injury - fluctuant, leathery eschar.  Malodorous.  Foam dressing ordered to protect.  WOC updated ICU care team of status; if goal is healing, a surgical consult is needed.  See assessment and photo below.    Deep tissue injury to upper back - pink/purple/maroon discoloration.  Foam dressing ordered to protect and treat.  WOC to monitor as injury evolves. See assessment and photo below.      Irritant Contact Dermatitis d/t moisture noted to bilateral groin skin - Inter-Dry Silver Wicking Fabric in use to eliminate moisture and allow skin damage to resolve.  See attached photo.    WOC to continue to monitor while hospitalized.  Braden precaution orders entered.  Patient is on a [employer] ICU surface - recommend waffle overlay to aid in turning as patient is very stiff.  Heel protection offloading boots in place for offloading.  Report given to RN, Renny.     11/07/22 0952   Wound 11/06/22 Sacrum (Unstageable Pressure Injury)   Date First Assessed/Time First Assessed: 11/06/22 1141   Present on Original Admission: Yes  Primary Wound Type: (c) Pressure Injury  Location: Sacrum  Wound Description (Comments): (Unstageable Pressure Injury)   Wound Image    Wound Etiology Pressure Unstageable   Dressing Status Clean;Dry;Intact   Dressing/Treatment Foam   Wound Length (cm) 8 cm   Wound Width (cm) 12 cm   Wound Depth (cm) 0 cm   Wound Surface Area (cm^2) 96 cm^2   Wound Volume (cm^3) 0 cm^3   Wound Assessment Eschar moist   Drainage Amount Scant (moist but unmeasurable)   Drainage Description Purulent   Odor Malodorous/putrid   Peri-wound Assessment Fragile;Non-blanchable erythema   Wound Thickness Description not for Pressure Injury Full thickness   Wound 11/06/22 Back Upper (Deep Tissue  Pressure Injury)   Date First Assessed/Time First Assessed: 11/06/22 1333   Present on Original Admission: Yes  Primary Wound Type: Pressure Injury  Location: Back  Wound Location Orientation: Upper  Wound Description (Comments): (Deep Tissue Pressure Injury)   Wound Image    Wound Etiology Deep tissue/Injury   Dressing Status New dressing applied   Wound Cleansed Wound cleanser   Dressing/Treatment Foam   Wound Length (cm) 9 cm   Wound Width (cm) 17 cm   Wound Surface Area (cm^2) 153 cm^2   Wound Assessment Purple/maroon;Pink/red   Drainage Amount Scant (moist but unmeasurable)   Drainage Description Serous   Odor None   Peri-wound Assessment Fragile;Non-blanchable erythema   Wound Groin Right;Left Irritant Contact Dermatitis r/t Moisture   No Date First Assessed or Time First Assessed found.   Present on Original Admission: Yes  Primary Wound Type: Other (comment)  Location: Groin  Wound Location Orientation: Right;Left  Wound Description (Comments): Irritant Contact Dermatitis r/t Mois...   Wound Image    Dressing/Treatment Interdry Ag/wicking fabric with Ag   Wound Assessment Pink/red;Superficial   Drainage Amount None (dry)   Odor None   Peri-wound Assessment Fragile   Wound Thickness Description not for Pressure Injury Partial thickness

## 2022-11-07 NOTE — Progress Notes (Signed)
 Initial order placed by hospitalist. Pt has since transferred to CVICU after being found hypersomnolent and hypotensive. Will place on medical hold, per protocol. Please re-consult once pt medically stable and appropriate for initiation of skilled therapeutic intervention.

## 2022-11-07 NOTE — Care Coordination-Inpatient (Signed)
11/07/22 SG:  Palliative care eval completed. PT made DNR but remains treatment focused. CM will follow for updates as workup progresses. -SG.

## 2022-11-07 NOTE — Progress Notes (Signed)
 11/07/22 1827   Encounter Summary   Encounter Overview/Reason  Initial Encounter   Service Provided For: Patient and family together   Referral/Consult From: Clergy/Religious Leader;Rounding   Support System Children   Last Encounter  11/07/22   Complexity of Encounter High   Spiritual/Emotional needs   Type Spiritual Support   Assessment/Intervention/Outcome   Assessment Unable to assess  (PC is ICU on comfort care.)   Intervention Active listening   Outcome Acceptance;Other (comment)  (Family is at PT bedside.)   Plan and Referrals   Plan/Referrals Continue to visit, (comment)     PC is receiving comfort care with family at bedside.

## 2022-11-07 NOTE — Progress Notes (Signed)
 Physical Therapy Attempt Note    Initial order placed by hospitalist. Pt has since transferred to CVICU after being found hypersomnolent and hypotensive. Will place on medical hold, per protocol. Please re-consult once pt medically stable and appropriate for initiation of skilled therapeutic intervention.    Josette Nat Mons, PT, DPT    Rehab Caseload Tracker

## 2022-11-07 NOTE — Consults (Signed)
 Chief Complaint:   Chief Complaint   Patient presents with    Altered Mental Status     Pt is brought by ems from home. Pt lives by herself and per ems she has been on the floor for at least 3 days. Pt has R sided facial droop, R sided weakness and slurred speech. Family tried to call her 3 days ago but hasn't heard anything. Pt soiled in her feces.        Consult Requested by: Beverley Hoard, PA       Reason for Consult: sacral decubitus ulcer     History of Present Illness:  Ashley Davies is a 87 y.o. female with history of aortic valve stenosis who was found down in her apartment. She states she went down on 3/1. She is not a great historian at present due to her dysphagia. Family are now at bedside (her son, his wife, and the patient's granddaughter). She does tell me she hurts all over. She admits to mild nausea. She was found to have a severe sacral decubitus ulcer on arrival and tells me this is new and just from the time period she was down. Upon arrival here she was found to have an AKI, rhabdo, and had suffered a stroke. She was made DNR but both patient and family express they remain treatment focused when particularly talking about this sacral wound. Our team was consulted to evaluate.      Surgical history includes: none.      Review of Systems:  Constitutional: No fevers, chills  Eye: No recent visual problems  ENMT: No ear pain, nasal congestion, sore throat  Respiratory: No shortness of breath, cough  Cardiovascular: No chest pain, palpitations, syncope  Gastrointestinal: No nausea, vomiting, diarrhea  Genitourinary: No hematuria  Heme/Lymph: Negative for bruising tendencies, swollen lymph glands  Endocrine: Negative for excessive thirst, excessive hunger  Musculoskeletal: No back pain, neck pain, joint pain, muscle pain  Integumentary: No rash, pruritus, abrasions  Neurologic: Alert and oriented x4  Psychiatric: No anxiety, depression    Past Medical History:     Past Medical History:   Diagnosis  Date    Hx of spinal cord injury     Leaking of urine         Allergies:   No Known Allergies      Family History:  Father: heart disease    Social History:  Smoked cigarettes when she was younger but not currently, denies alcohol use. Lived independently before this.       Physical Exam:  Physical Exam   Vitals:    11/07/22 1518 11/07/22 1533 11/07/22 1548 11/07/22 1603   BP: (!) 125/50 (!) 111/51 (!) 107/48 (!) 110/49   Pulse: 80 81 81 79   Resp: 13 16 14 16    Temp:    97.7 F (36.5 C)   TempSrc:    Bladder   SpO2: 96% 96% 97% 95%   Weight:       Height:          CBC:  Recent Labs     11/06/22  1149 11/06/22  2022 11/07/22  0347   WBC 21.7* 20.0* 14.9*   RBC 4.96 3.93 3.83   HGB 14.3 11.5 11.1*   HCT 44.7 36.2 35.3   MCV 90.1 92.1 92.2   RDW 14.0 14.0 14.5   PLT 280 229 246     CHEMISTRIES:  Recent Labs     11/06/22  1149 11/06/22  2022  11/07/22  0347   NA 145 147* 147*   K 4.0 3.7 3.8   CL 101 111* 110*   CO2 23 21* 20*   BUN 182* 169* 169*   CREATININE 2.8* 2.5* 2.8*   GLUCOSE 161* 143* 167*   MG  --   --  2.7*     PT/INR:No results for input(s): PROTIME, INR in the last 72 hours.  APTT:No results for input(s): APTT in the last 72 hours.  LIVER PROFILE:  Recent Labs     11/06/22  1149 11/06/22  2022 11/07/22  0347   AST 87* 73* 69*   ALT 82* 63* 58*   BILITOT 1.02 0.78 0.67   ALKPHOS 106 80 79     I/O last 3 completed shifts:  In: -   Out: 775 [Urine:775]  I/O this shift:  In: 5065.1 [I.V.:1077; IV Piggyback:3988.1]  Out: 650 [Urine:650]    General: no acute distress  Skin: warm, dry  Eyes: anicteric  Neck: supple  Respiratory: no labored breathing  Cardiac: regular rate  Abdomen: soft, nontender, nondistended  Neuro: slurred speech with right facial droop, right hemiplegia  Mental: alert       A/P:    Septic shock due to sacral decubitus ulcer, rhabdo, aspiration pneumonia  Bacteremia  -Met sepsis criteria on admission due to hypothermia, leukocytosis, lactic acidosis, and hypotension requiring pressor  support.  -Labs reviewed. WBC 15,000 from 20. Cr 2.8. Na 147, CO2 20.  -Blood cultures positive for GPC. Abx per primary team.  -CTAP personally reviewed. No abscess appreciated at level of sacral decubitus ulcer. Wound is quite severe and certainly will need debridement to remove the eschar. Confirmed with family and patient that though she is DNR she wishes to remain treatment focused and wants to proceed with surgery. Would like to medically optimize her after such a large insult prior to proceeding--I posted her surgery for Monday but can reassess daily. Discussed with primary team. Offload wound as able with frequent turning.  -Agree with Dobhoff to initiate nutrition.    ICU managing:   #Metabolic encephalopathy  #Dysphagia  #CVA  #Aspiration pneumonia  #AKI  #Rhabdo  #acute hypoxemic resp failure        Lorane Royalty, PA-C  General Surgery  11/07/22

## 2022-11-07 NOTE — Progress Notes (Signed)
 Inpatient      Acute Care Speech Pathology Dysphagia & Speech Evaluation  Acknowledge Orders  Time In/Out  SLP Charge Capture  Rehab Caseload Tracker    Ashley Davies   July 14, 1936  [id number]   11/07/2022        Therapy Minute Tracking       SLP Individual Minutes 1400  1435  35            Altered mental status     Therapy Orders SLP Eval and Treat    Reason For Referral Dysphagia R13.10    Past Medical History  Past Medical History:   Diagnosis Date    Hx of spinal cord injury     Leaking of urine        Past Surgical History      Procedure Laterality Date    LITHOTRIPSY      TONSILLECTOMY AND ADENOIDECTOMY         Allergies No Known Allergies     General  General  Chart Reviewed: Yes  General Comment  Comments: 87 y.o. female admitted with Altered mental status.  She  has a past medical history of Hx of spinal cord injury and Leaking of urine.  She also  has a past surgical history that includes Tonsillectomy and adenoidectomy and Lithotripsy. 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 Glasses not present and unable to see paper for AAC purposes.  Behavior/Cognition  Behavior/Cognition: Alert, Cooperative, Pleasant mood (smiling and laughing at appropriate times;)    Assessment  Subjective  Subjective: pt able to nod head to acknowledge yes and no questions correctly 100%. Glasses not present and unable to see paper for AAC purposes.  Baseline Assessment  Communication Observation: Aphasia, Apraxia, Dysarthria  Follows Directions: Simple  Current Diet : NPO  Current Liquid Diet : NPO  Dentition: Adequate  Patient Positioning: Upright in bed  Baseline Vocal Quality: Normal  Volitional Cough: Weak     Pain       Vision  and Hearing  Vision  Vision Exceptions: Wears glasses at all times (not present during evaluation)  Hearing  Hearing: Within functional limits    Oral Motor  Labial: Decreased seal, Decreased rate, Flaccid, Right droop  Dentition: Natural  Oral Hygiene: Dried secretions  Oral Hygiene Comments: dried secretions removed c familya nd NSG educated on how to use suction equipment  Lingual: No impairment  Velum: No Impairment     Oral Pharyngeal  Oral Phase: Exceptions  Assessment Method(s): Observation, Palpation  Vocal Quality: Fatigue  Consistency Presented: Thin, Mildly Thick (tsp presentation; cold and room temperature)  How Presented: Spoon, Self-fed/presented  Bolus Acceptance: Impaired (difficulty initiating lip closure around spoon s imitative cues)  Bolus Formation/Control: Impaired  Type of Impairment: Oral holding, Poor, Lip closure, Suspected premature spilling  Propulsion: Delayed (# of seconds), Tongue pumping, Discoordination  Oral Residue: 10-50% of bolus  Initiation of Swallow: Absent (delayed cough; no initiation of swallow despite max cues of questions c responses, lip smacking cues, tongue press;)       Dysphagia Diagnosis  Dysphagia Diagnosis: Severe oral stage dysphagia, Severe pharyngeal stage dysphagia  Dysphagia Impression : Rec for Strict NPO c consideration fors mall bore feeding tube made to  pt, NSG and dietician  Dysphagia Outcome Severity Scale: Level 1: Severe dysphagia- NPO. Unable to tolerate any PO safely    Recommendations- Pt presents c severe apraxia, dysarthria, and dysphagia. Difficult to discern language involvement, 2 severity of apraxia. Pt demonstrates difficulty initiating verbal responses but situation improves c imitative cues.     Requires SLP Intervention: Yes  Recommendations: NPO, Consider alternative nutrition, Therapeutic feeds with SLP only, Dysphagia treatment  D/C Recommendations: Ongoing speech therapy is recommended during this hospitalization, Ongoing speech  therapy is recommended at next level of care, 24 hour supervision/assistance  Referral To: OT, PT, Dietician  Diet Solids Recommendation: NPO  Liquid Consistency Recommendation: NPO  Recommended Form of Meds: Via alternative means of nutrition  Therapeutic Interventions: Bolus control exercises, Patient/Family education, Diet tolerance monitoring, Oral care, Therapeutic PO trials with SLP, Oral motor exercises, Free Water Protocol, Thermal stimulation  Patient Education: Pt nodded head in attempt to communicate c SLP to answer yes/no understanding that NPO rec'd.  Patient Education Response: Demonstrated understanding  Duration of Treatment: 2 wks  Frequency of Treatment: 2-5/wk    Plan     Prognosis: Good      Consulted and agree with results and recommendations: Patient, RN  RN Name: Renny    Goals  Long-term Goals  Goal 1: Tolerate least restrictive diet to meet long term nutritional needs safely by mouth.  Goal 2: Demonstrate functional communication via multi modalities to make needs known to unfamiliar communication partner.  Short-term Goals  Goal 1: Initiate single word initiation to provide topic of communication using exaggerated speech 50% c mod cues.  Goal 2: Initiate oral phase manipulation and trigggering of swallow c cold tsp bolus within 5 secs.  Goal 3: Initiate use of gestures to express self on basic need.  Goal 4: Communicate basic needs via F03 by overexaggerated speech.      Safety  Safety Devices in place: Yes  Type of devices: Bed alarm in place, Nurse notified  Restraints Initially in Place: No   Bed alarm in place, Nurse notified

## 2022-11-07 NOTE — Progress Notes (Signed)
 ICU Progress Note    Date:11/07/2022       Room:5003/01  Patient Name:Ashley Davies     Date of Birth:[birth year]     Age:87 y.o.    Subjective   Interval History Status: improved.     Complains of back pain    Daily Summary:  3/7: Admitted to hospitalist service after being found down at home. Right hemiplegia and right facial droop. 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 AKI and sacral decubitus ulcer. CK 700s, IVF, tx to ICU due to hypotension, hypoxemia. CVC placed. IVC flat, IVF continued, Zosyn   3/8: Creat increased to 2.8, 495 cc UOP overnight, levophed  at 5 mics, 1L 0.45NS, palliative care consultation, General Surgery consultation for sacral decubitus wound with eschar and fluctuance    Review of Systems   ROS as previously reviewed     Medications   Scheduled Meds:    aspirin   324 mg Oral Once    tuberculin  5 Units IntraDERmal Once    aspirin   300 mg Rectal Daily    sodium chloride  flush  5-40 mL IntraVENous 2 times per day    heparin  (porcine)  5,000 Units SubCUTAneous 3 times per day    lactated ringers   500 mL IntraVENous Once    piperacillin -tazobactam  3,375 mg IntraVENous Q12H     Continuous Infusions:    lactated ringers  IV soln      sodium chloride       norepinephrine  2.5 mcg/min (11/06/22 2001)     PRN Meds: sodium chloride  flush, sodium chloride , ondansetron  **OR** ondansetron , bisacodyl , senna, aluminum & magnesium hydroxide-simethicone, acetaminophen  **OR** acetaminophen       Physical Examination      Vitals:  BP (!) 99/48   Pulse 90   Temp 97.3 F (36.3 C) (Bladder)   Resp 11   Ht 1.727 m (5' 8)   Wt 88 kg (194 lb 0.1 oz)   SpO2 100%   BMI 29.50 kg/m   Temp (24hrs), Avg:96.2 F (35.7 C), Min:95.9 F (35.5 C),  Max:97.3 F (36.3 C)      I/O (24Hr):    Intake/Output Summary (Last 24 hours) at 11/07/2022 0756  Last data filed at 11/07/2022 0600  Gross per 24 hour   Intake --   Output 775 ml   Net -775 ml       General: Alert, cooperative, no obvious distress, elderly, ill-appearing female  Neck: supple, no JVD  Lungs: Clear to auscultation, non-labored respirations, no wheezing  Heart: Normal rate, regular rhythm, no murmur, 2-3/6 SEM  Musculoskeletal: Warm, well perfused, no joint swelling or tenderness  Abdomen: Soft, non-tender, non-distended, normal bowel sounds  Neurologic: Right facial droop, right hemiplegia, expressive aphasia, follows commands with left side.   Skin:  sacral area with approx 13cm x10 cm black eschar  with underlying fluctuance  Extremities: no cyanosis, no edema     Labs/Imaging/Diagnostics   Labs:  CBC:  Recent Labs     11/06/22  1149 11/06/22  2022 11/07/22  0347   WBC 21.7* 20.0* 14.9*   RBC 4.96 3.93 3.83   HGB 14.3 11.5 11.1*   HCT 44.7 36.2 35.3   MCV 90.1 92.1 92.2   RDW 14.0 14.0 14.5   PLT 280 229 246  CHEMISTRIES:  Recent Labs     11/06/22  1149 11/06/22  2022 11/07/22  0347   NA 145 147* 147*   K 4.0 3.7 3.8   CL 101 111* 110*   CO2 23 21* 20*   BUN 182* 169* 169*   CREATININE 2.8* 2.5* 2.8*   GLUCOSE 161* 143* 167*   MG  --   --  2.7*     PT/INR:No results for input(s): PROTIME, INR in the last 72 hours.  APTT:No results for input(s): APTT in the last 72 hours.  LIVER PROFILE:  Recent Labs     11/06/22  1149 11/06/22  2022 11/07/22  0347   AST 87* 73* 69*   ALT 82* 63* 58*   BILITOT 1.02 0.78 0.67   ALKPHOS 106 80 79       Imaging Last 24 Hours:  XR CHEST PORTABLE    Result Date: 11/06/2022  CHEST:   AP (or PA),  11/06/2022 7:24 PM INDICATION: line placement  COMPARISON: Earlier same day FINDINGS: Right IJ central line is seen with its tip over superior vena cava. No  pneumothorax is appreciated. Cardiac silhouette is probably not enlarged allowing for poor inspiration. Minimal  curvilinear atelectasis or scarring in the left lung base again seen. No  pleural fluid.     Placement of right IJ central line with its tip in SVC and no pneumothorax appreciated.    XR CHEST PORTABLE    Result Date: 11/06/2022  Single View Chest: 11/06/22,17:20 History:  S20.429A,Blister (nonthermal) of unspecified back wall of thorax, initial encounter,ICD-10-CM  decreased sat/ possible fluid over;load Comparison: 11/06/2022, 11:43 Findings: Lung volumes are lower. Pulmonary vascularity and reticulonodular opacities are accentuated. Mild cardiomegaly is present with left ventricular prominence. Patient is rotated and tilted to the left.     Impression:Pulmonary venous hypertension.     MRA NECK W CONTRAST    Result Date: 11/06/2022  MRA Neck with gadolinium 11/06/22 COMPARISON: None INDICATION: cva, TECHNIQUE: Gadolinium enhanced 3-D time-of-flight MR angiography of the neck FINDINGS:  Use of NASCET Criteria RIGHT ICA: Right ICA origin shows moderate narrowing, likely not exceeding 30-40%. LEFT ICA: Left ICA origin suggest no significant stenosis, favor mild narrowing.  Small opacified structure at the proximal ICA, cannot exclude ulcerated plaque VERTEBRAL ARTERIES: Vertebral arteries are widely patent in their mid and distal  segments, origins are not well delineated AORTIC ARCH: Arch is limited, proximal brachial cephalic vessels are not well visualized, difficult to differentiate anatomic from artifact     1. MRA neck: Limited by excessive motion artifact Right ICA, mild to moderate narrowing, likely not exceeding 30-40% Left ICA, mild narrowing at the origin, small vascular structure near the bifurcation,                Cannot exclude 5 mm ulcerated plaque Vertebral arteries are bilaterally patent, proximal segments, origins, as well as the origins of the other brachiocephalic vessels are not well visualized, cannot differentiate artifact from anatomic    MRI BRAIN WO CONTRAST    Result Date: 11/06/2022  MRI and MRA  Brain without contrast: 11/06/22 CLINICAL INDICATION:  CVA. Found on floor, right-sided facial droop, right-sided  weakness with slurred speech TECHNIQUE: Sagittal and axial spin-echo T1-weighted, axial fast spin-echo T2-weighted, FLAIR, diffusion, and gradient echo imaging.Coronal diffusion. 3-D time-of-flight MR angiography of the brain COMPARISON: CT scan November 06, 2022. FINDINGS: MRI BRAIN: No mass or shift of midline structure. Acute left para midline pontine infarct, 14 mm in diameter.  Small acute infarct anterior inferior medial right cerebellum, 7 mm. Small acute/subacute lacunar infarct left frontal white matter. Severe underlying small vessel change. Generalized atrophy with prominent temporal lobe involvement.  The major intracranial vessels are patent.  The paranasal sinuses and mastoid air cells are clear. Orbital structures are normal. Craniocervical junction is normal. MRA BRAIN: Distal internal carotid arteries are bilaterally patent. Anterior and middle cerebral arteries are patent. Focal moderate stenosis left P2, severe stenosis left ACA anterior to the genu. The distal vertebral basilar arteries are patent.  Severe stenosis of both posterior cerebral arteries, on the right both the proximal and distal right P2, on the left mid P2 facet.     1. MRI brain 14 mm acute perforator infarct left pons 7 mm acute infarct right anterior inferior cerebellum 4 mm acute/subacute lacunar infarct left frontal, periventricular white matter No hemorrhage, severe underlying small vessel change Generalized atrophy with prominent temporal lobe involvement 2. MRA brain No large vessel occlusion. Left ACA, severe stenosis Left M2, moderate stenosis Right P2, severe stenosis proximal and distal segment Left P2, severe stenosis mid segment    MRA HEAD WO CONTRAST    Result Date: 11/06/2022  MRI and MRA Brain without contrast: 11/06/22 CLINICAL INDICATION:  CVA. Found on floor, right-sided facial droop, right-sided   weakness with slurred speech TECHNIQUE: Sagittal and axial spin-echo T1-weighted, axial fast spin-echo T2-weighted, FLAIR, diffusion, and gradient echo imaging.Coronal diffusion. 3-D time-of-flight MR angiography of the brain COMPARISON: CT scan November 06, 2022. FINDINGS: MRI BRAIN: No mass or shift of midline structure. Acute left para midline pontine infarct, 14 mm in diameter. Small acute infarct anterior inferior medial right cerebellum, 7 mm. Small acute/subacute lacunar infarct left frontal white matter. Severe underlying small vessel change. Generalized atrophy with prominent temporal lobe involvement.  The major intracranial vessels are patent.  The paranasal sinuses and mastoid air cells are clear. Orbital structures are normal. Craniocervical junction is normal. MRA BRAIN: Distal internal carotid arteries are bilaterally patent. Anterior and middle cerebral arteries are patent. Focal moderate stenosis left P2, severe stenosis left ACA anterior to the genu. The distal vertebral basilar arteries are patent.  Severe stenosis of both posterior cerebral arteries, on the right both the proximal and distal right P2, on the left mid P2 facet.     1. MRI brain 14 mm acute perforator infarct left pons 7 mm acute infarct right anterior inferior cerebellum 4 mm acute/subacute lacunar infarct left frontal, periventricular white matter No hemorrhage, severe underlying small vessel change Generalized atrophy with prominent temporal lobe involvement 2. MRA brain No large vessel occlusion. Left ACA, severe stenosis Left M2, moderate stenosis Right P2, severe stenosis proximal and distal segment Left P2, severe stenosis mid segment    CT ABDOMEN PELVIS WO CONTRAST Additional Contrast? None    Result Date: 11/06/2022  CT abdomen pelvis without contrast: 11/06/22 INDICATION: low back wound N17.9,Acute kidney failure, unspecified,ICD-10-CM COMPARISON:  None TECHNIQUE: Routine noncontrast protocol (Axial imaging from the lung bases  to the pubic symphysis with coronal reconstruction.) CT scanning was performed using radiation dose reduction techniques when appropriate, per system protocols. FINDINGS: Evaluation of the viscera and vasculature is diminished without the use of IV contrast.. Lower thorax: No pleural or pericardial effusion.  Partially imaged coronary artery calcifications. Mitral annular calcification. Bibasilar atelectasis. Small hiatal hernia. Liver and Biliary:  Liver normal size and contour.   No biliary dilatation.  No gallbladder wall thickening or pericholecystic fluid. No radiopaque gallstones. Pancreas:  Pancreas appears  atrophic with some scattered mild calcifications likely sequela of prior pancreatitis without CT evidence of acute pancreatitis at this time. Spleen:  Normal. Adrenal Glands:  Normal.  Urinary system:  No hydronephrosis. 9 mm nonobstructing right kidney stone. Otherwise unremarkable noncontrast appearance the right kidney. Mild diffuse atrophy and scattered cortical scarring left kidney.  2 mm stone in the urinary bladder. Urinary bladder decompressed by Foley catheter.  Pelvic reproductive organs:  Unremarkable.   Peritoneal cavity:  No free air or fluid.  Vascular:  No aortic aneurysm.  Moderate/severe atherosclerosis. Lymph nodes:  None enlarged.  Gastrointestinal:  No bowel obstruction.   Appendix not seen, but there is no pericecal inflammatory change to suggest appendicitis.   Colonic diverticulosis without diverticulitis .  Partially imaged tubing terminates in the lower rectum. Bones: No definite acute bony findings. Bones appear osteopenic. There are multilevel compression deformities of the lumbar spine and at T12, age-indeterminate but favored to be chronic. Multilevel degenerative changes of the spine. Additional findings, if any: Pelvic floor relaxation..      1. No definite acute findings by noncontrast technique.  2. 9 mm nonobstructing right kidney stone. 2 mm stone urinary bladder. Urinary  bladder decompressed by Foley catheter. 3. Pelvic floor relaxation. 4. Bones appear osteopenic. Multilevel compression deformities lumbar spine and at T12 are technically age-indeterminate but favored to be chronic. Correlate for point tenderness.    CT Head W/O Contrast    Result Date: 11/06/2022  CT head without: 11/06/22 INDICATION: AMS N17.9,Acute kidney failure, unspecified,ICD-10-CM COMPARISON: None TECHNIQUE: Noncontrast axial imaging from foramen magnum to the vertex. CT scanning was performed using radiation dose reduction techniques when appropriate, per system protocols. FINDINGS: No intra-axial or extra axial hemorrhage. No extra-axial fluid collection. No intra-axial mass effect or midline shift. Bilateral cerebellar lacunar infarcts. Elsewhere Preservation of gray-white differentiation. Mild atrophy with increased sulcation and ex-vacuo dilatation of the ventricles. No hydrocephalus. Moderate/severe white matter hypodensities, predominantly periventricular, nonspecific but are likely chronic microangiopathic ischemic changes. Suprasellar and quadrigeminal plate cisterns are patent, without herniation. No soft tissue abnormality, fracture or osseous lesion. Visualized portions of the orbits are normal. Included paranasal sinuses and mastoid air cells are predominantly clear.     No acute intracranial abnormality.   Chronic findings as above.    XR CHEST PORTABLE    Result Date: 11/06/2022  Chest AP: 11/06/22 INDICATION: Altered Mental Status. COMPARISON: None available for review. FINDINGS: Lungs/pleura: Consolidative opacities in the right midlung and left lung base. No pleural effusion or pneumothorax. Cardiomediastinum: Unremarkable. Bones/ Soft tissues: No acute osseous abnormality.     Consolidative opacities in the right midlung and left lung base. Findings could reflect pneumonia in the appropriate clinical context, however underlying nodule  not excluded. At a minimum, recommend short-term follow-up  two-view chest radiograph in 4-6 weeks for further evaluation.         Assessment        Hospital Problems             Last Modified POA    * (Principal) Altered mental status 11/06/2022 Yes       Plan:      Altered mental status, found down  Metabolic encephalopathy  -May have been down as long as 6 days  - Unclear if fall was caused by a stroke or if patient may subsequently had a stroke while lying on the ground  -Mental status seems to be improving with fluids but does still appear to have right facial droop and  right sided weakness with garbled speech     Right facial droop, right-sided weakness, Dysphagia, possible CVA  - Known left carotid stenosis  - MRI, MRA head and neck showed no LVO, several areas of stroke  - Continue aspirin   - Echo pending  - Neuro consult  - Fasting lipid panel  - Speech therapy, PT, OT, PPD     Aspiration pneumonia with severe sepsis +/- hypovolemic shock  Hypothermia  Lactic acidosis  Leukocytosis  -Bair hugger  -Blood cultures sent  - Patient has been given a 30 mg/kg bolus  -Continue cefepime  and Flagyl  started in the ER, will change to Zosyn   - Needs follow-up imaging as radiology reported they could not exclude underlying nodule     AKI, mild rhabdomyolysis  - IV fluids  - Monitor renal function carefully  -Consider additional workup with renal function does not resolve with fluids  - trend CPK and lactic acid     Sacral wound with eschar, present on admission  - approximately 13 x10 cm eschar in sacral area with fluctuance  - Due to prolonged downtime  - Wound care consult  - General surgery consultation     Acute hypoxemic resp failure  -due to shock and presumed pna  -O2 as needed, tx underlying acute disease     Elevated troponin, aortic valve stenosis  - Likely stress-induced ischemia  - Echo with history of aortic valve stenosis     Chronic compression fracture  - Patient with chronic back pain and some debility  - PT, OT     35 minutes CC time   Dispo: Admit for further  workup  Code status: No Order   Medical Decision maker: Extended Emergency Contact Information  Primary Emergency Contact: Mandarino,David, son.   Mobile Phone: [phone]  Relation: Child  Preferred language: English  Interpreter needed? No  Secondary Emergency Contact: Mandarino,Jennifer  Mobile Phone: [phone]  Relation: Child  Preferred language: English  Interpreter needed? No   PCP: Claudene Carlin CROME, MD     Electronically signed by Devere CHRISTELLA Hoard, PA on 11/07/22 at 7:56 AM EST

## 2022-11-07 NOTE — Progress Notes (Signed)
 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 female admitted with Altered mental status.  She  has a past medical history of Hx of spinal cord injury and Leaking of urine.  She also  has a past surgical history that includes Tonsillectomy and adenoidectomy and Lithotripsy.    Subjective:     Pt and RN agreeable to OT, supportive family bedside     Additional Pertinent History:        Social Environment  Prior Level of Function ADL  Prior Level of Function IADL    Lives With: Alone  Type of Home: Apartment  Home Layout: One level  Home Access: Level entry  Home Equipment: Walker, rolling  Ambulation Assistance: Independent  Transfer Assistance: Independent  ADL Assistance: Independent  Homemaking Assistance: Surveyor, Minerals: Yes  Leisure & Hobbies: reading    History of Falls:No    Comments: Additional Comments: no additional assistance available per family      OBJECTIVE     Vital Signs / Pain Lines & Drains /Precautions   Vitals  Pulse: 89  Heart Rate Source: Monitor  BP: (!) 125/57  BP Location: Right upper arm (recieved with BP cuff on RUE)  BP Method: Automatic  Patient Position: Semi fowlers  MAP (Calculated): 80  SpO2: 100 %  O2 Device: Reservoir cannula  Comment:  7.5 L/min oxymizer. BP sitting EOB: 111/57 mm Hg and later 106/56 mm Hg. Pt asymptomtic     Pain      CVC Triple Lumen 11/06/22 Right Internal jugular (Active)       Peripheral IV 11/06/22 Left Antecubital (Active)       Peripheral IV 11/06/22 Left Forearm (Active)       Urinary Catheter 11/06/22 2 Way (Active)       Precautions/Restrictions  Restrictions/Precautions  Restrictions/Precautions: Fall Risk  Position Activity Restriction  Other position/activity restrictions: R hemiparesis     Orientation/Cognition Vision/Hearing   Orientation  Orientation Level: Oriented to place;Disoriented to time;Oriented to person  Cognition  Overall Cognitive Status: Exceptions  Following Commands: Follows one step commands with increased time;Follows one step commands with repetition Vision  Vision: Within Functional Limits  Hearing  Hearing: Within functional limits      Objective Assessment  Gross Assessment    Gross Assessment  AROM: Generally decreased, functional (L UE WFL, R UE NON-FUNCTIONAL (flaccid on eval))  Strength: Generally decreased, functional  Coordination: Generally decreased, functional  Sensation: Impaired      ROM   LUE AROM (degrees)  LUE AROM : WFL  RUE AROM (degrees)  RUE AROM : Exceptions  RUE General AROM: Flaccid      Balance    Sitting: Intact  Sitting - Static: Poor (constant support);Fair (  occasional) (Pt requiring Mod A w/ initial EOB sitting, intermittent SBA w/ increased time sitting)           Activities of Daily Living     Feeding: NPO  Grooming: Maximum assistance  UE Bathing: Maximum assistance  LE Bathing: Dependent/Total  UE Dressing: Dependent/Total  LE Dressing: Dependent/Total  Toileting: Dependent/Total    Equipment Provided:      Comments: ADL based on functional observation       Functional Activity  Bed Mobility  Transfers  Balance  Gait & W/C Mobility     Bed Mobility Training  Bed Mobility Training: Yes  Overall Level of Assistance: Maximum assistance;Assist X2;Total  assistance  Rolling: Maximum assistance;Total assistance;Assist X2  Supine to Sit: Maximum assistance;Assist X2  Sit to Supine: Total assistance;Assist X2     Balance  Sitting: Intact  Sitting - Static: Poor (constant support);Fair (occasional) (Pt requiring Mod A w/ initial EOB sitting, intermittent SBA w/ increased time sitting)       Comments:      Therapeutic Activity Treatment (14 Minutes) CPT 97530: Therapeutic activity included Rolling, Supine to Sit, Sit to Supine, and Sitting Balance  to improve functional Activity tolerance, Balance, Coordination, Mobility, Strength, and Increase independence.  Co-Treatment PT/OT necessary due to patient's decreased overall endurance/tolerance levels, as well as need for high level skilled assistance to complete functional transfers/mobility and functional tasks    Safety: Type of Devices: All boney prominences offloaded;Nurse notified;Heels elevated for pressure relief;Patient at risk for falls;Left in bed;Call light within reach;Bed alarm in place;All fall risk precautions in place     Education: Education Given To: Patient;Family  Education Provided: Role of Designer, Industrial/product  Education Method: Demonstration;Verbal;Teach Back  Barriers to Learning: Cognition  Education Outcome: Verbalized understanding;Demonstrated understanding;Continued education needed    [employer] AM-PACT "6 Clicks" Basic ADL Inpatient Short Form   How much help is needed for putting on and taking off regular lower body clothing?: Total  How much help is needed for bathing (which includes washing, rinsing, drying)?: Total  How much help is needed for toileting (which includes using toilet, bedpan, or urinal)?: Total  How much help is needed for putting on and taking off regular upper body clothing?: A Lot  How much help is needed for taking care of personal grooming?: A Lot  How much help for eating meals?: Total  AM-PAC Inpatient Daily Activity Raw Score: 8  AM-PAC  Inpatient ADL T-Scale Score : 22.86  ADL Inpatient CMS 0-100% Score: 85.69  ADL Inpatient CMS G-Code Modifier : CM     ASSESSMENT   Assessment   Pt seen for skilled OT Evaluation and treatment following [facility] admission after being found down at home. Right hemiplegia and right facial droop. 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 AKI and sacral decubitus ulcer. Pt and family education intiaited for role of OT, rehab services in [facility] and goals of evaluation. Pt presenting with significant functional deficits with R UE flaccid on eval and unable to facilitate trace contraction, L UE WFL w/ increased time allowed and dependent on vision for reach, decreased strength, endurance, balance and functional mobility impairing ability to complete self care ADL. Pt benefited from initiation of bed mobility and EOB sitting to increase core stability, upright  tolerance in prep for progressive transfer and ADL re-training. BP dropping EOB to 106/65 (67) and returned to supine for safety. Pt would benefit from continued OT in [facility] and [facility] at d/c to maximize return to PLOF and developement of compensatory ADL strategies as needed. Will progress as tolerated.      Evaluation Complexity:  Medium Complexity      POST ACUTE RECOMMENDATIONS   Setting: Inpatient Rehabilitation Facility (IRF)    Justification for Recommended Setting: Recommended as patient is good candidate and motivated to obtain maximum functional independence. Pt would benefit from working with a specialized multidisciplinary team and can tolerate therapy 3 hours a day 5 days a week.    Equipment:        PLAN   Frequency and Duration 5 times/week for duration of hospital stay or until stated goals are met,  whichever comes first.    Ms. Boettner presents with Fair therapy prognosis. Skilled intervention is medically necessary to address the following performance deficits: Decreased functional mobility , Decreased safe awareness, Decreased balance, Decreased coordination, Decreased ADL status, Decreased cognition, Decreased endurance, Decreased strength, Decreased posture, Decreased sensation, Decreased fine motor control, Decreased ROM.  Benefits and precautions of occupational therapy have been discussed with Ms. Adin, and the following interventions are recommended: Strengthening, Balance training, Functional mobility training, Endurance training, Neuromuscular re-education, Safety education & training, Equipment evaluation, education, & procurement, Patient/Caregiver education & training, Self-Care / ADL.    Goals    Short Term Goals Long Term Goals   Time Frame for Short Term Goals: 2 wks  Short Term Goal 1: Sup <> sit EOB Mod A to participate in ADL re-training  Short Term Goal 2: Pt will tolerate 10 min therapeutic activity seated EOB SBA close supervision  Short Term Goal 3: Mod A sit <> stand to decrease dependency on caregivers during ADL  Short Term Goal 4: BSC transfer Max A w/ least restrictive method  Short Term Goal 5: Pt, family caregivers will be Min A for PROM, AAROM HEP of R UE Time Frame for Long Term Goals : 4 wks  Long Term Goal 1: Pt will maximize independence in all valued self care ADL for safe return home      Therapist Signature: Leeroy FORBES Pitt, OTR/L    Date: 11/07/2022

## 2022-11-07 NOTE — Procedures (Signed)
 Cortrak Enteral Access: Nasoenteric Feeding Tube Placement    Tube placement requested by: Dr. Delayne  Reason for NEFT placement: Need for enteral access  10 French Cortrak feeding tube was placed to 75 cm at left  Location on Cortrak: Postpyloric  Secured with: Nasal Bridle    Tube is ready for use: Yes  X-Ray required prior to use for feeding: No  Monitoring for spontaneous post-pyloric passage: No   Complications/ Comments: Pt tolerated procedure well. Tube appears to be post-pyloric. Shared image with PA.  NEFT Placed with tracing, post-pyloric

## 2022-11-07 NOTE — Progress Notes (Addendum)
 Comprehensive Nutrition Assessment    Type and Reason for Visit:  Initial, Positive Nutrition Screen, Wound (Wound/ICU LOS)    Nutrition Recommendations/Plan:   Pt NPO. SLP rec NPO at this time.  Initiate Vital AF 1.2 via NEFT at 59ml/hr with 5ml/hr FWF.  -If pt tolerates and electrolytes WNL, advance 10ml q4hr to goal rate of Vital AF 1.2 at 39ml/hr with 60ml/hr FWF+Juven BID.  -This formula is to provide 1764kcal (99%EEN), 103g pro (97%EPN), 147g carb, and of water.  -Total fluid provision: 1069+480=1538ml of water daily.  *FWD: 2L (Currently being addressed with LR at 126ml/hr).  3. Rec MVI, Thiamine in the setting of malnutrition. Pt at risk for refeeding, replace electrolytes as needed.     Malnutrition Assessment:  Malnutrition Status:  Moderate malnutrition (11/07/22 1122)    Context:  Acute Illness     Findings of the 6 clinical characteristics of malnutrition:  Energy Intake:  50% or less of estimated energy requirements for 5 or more days  Muscle Mass Loss:  Mild muscle mass loss Temples (temporalis), Clavicles (pectoralis & deltoids)      Nutrition Assessment:     87 y.o. female with significant past medical history of aortic valve stenosis, left carotid stenosis found down on the floor of her apartment after family did not hear from her. Aspiration PNA with sepsis, sacral wound, right sided facial droop suspected CVA, dysphagia, AKI, respiratory failure per MD note.    3/8-Pt on nasal cannula. No BM recorded this admission. No edema noted. She is requiring one pressor (Levophed  3). She is slightly hypernatremic. LR infusing at 150ml/hr (3600ml/daily)  Nutrition:  3/8-Pt unable to participate fully during interview during expressive aphasia. Her son was at bedside and reported that she was down for about 6 days, no food or hydration during that time. No weight loss per EMR. Per Son, she is independent and lives alone. Typically does her own cooking and grocery shopping. No issues with access to  food noted. SLP consulted to assess patients ability to swallow. She is NPO with no recorded po intake in the EMR. NEFT in place, PA agreeable for RD to initiate TF order.    Past Medical History:   Diagnosis Date    Hx of spinal cord injury     Leaking of urine      Lab Results   Component Value Date/Time    NA 147 11/07/2022 03:47 AM    K 3.8 11/07/2022 03:47 AM    CL 110 11/07/2022 03:47 AM    CO2 20 11/07/2022 03:47 AM    BUN 169 11/07/2022 03:47 AM    CREATININE 2.8 11/07/2022 03:47 AM    GLUCOSE 167 11/07/2022 03:47 AM    CALCIUM  8.4 11/07/2022 03:47 AM    MG 2.7 11/07/2022 03:47 AM     Lab Results   Component Value Date/Time    TRIG 129 02/03/2022 12:12 PM     Lab Results   Component Value Date/Time    POCGLU 123.0 11/06/2022 05:18 PM    POCGLU 129.0 11/06/2022 04:44 PM     Scheduled Meds:   tuberculin  5 Units IntraDERmal Once    aspirin   300 mg Rectal Daily    sodium chloride  flush  5-40 mL IntraVENous 2 times per day    heparin  (porcine)  5,000 Units SubCUTAneous 3 times per day    piperacillin -tazobactam  3,375 mg IntraVENous Q12H     Continuous Infusions:   lactated ringers  IV soln 150 mL/hr  at 11/07/22 1026    sodium chloride       norepinephrine  3 mcg/min (11/07/22 0900)     PRN Meds:.sodium chloride  flush, sodium chloride , ondansetron  **OR** ondansetron , bisacodyl , senna, aluminum & magnesium hydroxide-simethicone, acetaminophen  **OR** acetaminophen       Nutrition Related Findings:      Wound Type: Pressure Injury, Unstageable, Deep Tissue Injury (DTI of upper back, unstageable pressure injury of sacrum.-WOCN following.)       Current Nutrition Intake & Therapies:    Average Meal Intake: NPO  Average Supplements Intake: NPO  Diet NPO    Anthropometric Measures:  Height: 173 cm (5' 8.11)  Ideal Body Weight (IBW): 141 lbs (64 kg)       Current Body Weight: 88 kg (194 lb 0.1 oz), 137.6 % IBW.    Current BMI (kg/m2): 29.4  Usual Body Weight: 88.2 kg (194 lb 7.1 oz)  % Weight Change (Calculated):  -0.2  Weight Adjustment For: No Adjustment                 BMI Categories: Overweight (BMI 25.0-29.9)    Wt hx:  88.2kg (02/11/22)  88kg (11/06/22)-Wt relatively stable per EMR.    Estimated Daily Nutrient Needs:  Energy Requirements Based On: Formula  Weight Used for Energy Requirements: Current  Energy (kcal/day): 1789kcal (MSJX1.3)  Weight Used for Protein Requirements: Current  Protein (g/day): 106-132g (1.2-1.5g/kg)  CHO: 224g (50%EEN)  Method Used for Fluid Requirements: 1 ml/kcal  Fluid (ml/day): (54ml/kcal) or per MD.    Nutrition Diagnosis:   Moderate malnutrition, In context of acute illness or injury related to inadequate protein-energy intake as evidenced by Criteria as identified in malnutrition assessment (pt NPO pending SLP assessment.)    Nutrition Interventions:   Food and/or Nutrient Delivery: Continue NPO, Start Tube Feeding             Goals:     Goals: Meet at least 75% of estimated needs, PO intake 75% or greater, Initiate nutrition support, Tolerate nutrition support at goal rate, by next RD assessment, other (specify)  Specify Other Goals: Prevent weight loss.    Nutrition Monitoring and Evaluation:    Wt trends, po intake, ONS tolerance, gi function, nutrition support needs, labs.          Discharge Planning:    Too soon to determine     Ashley Davies, RD

## 2022-11-07 NOTE — Consults (Signed)
 Palliative Medicine Consultation      Reason for Consult:    Goals of care    Requesting Physician:  Delayne Dedrick Redbird, MD    CHIEF COMPLAINT:  Found down at home    History Obtained From:  patient, family member - son, electronic medical record    HISTORY OF PRESENT ILLNESS:    The patient is an 87 year old woman with aortic stenosis, left carotid stenosis who presented after being found down in her apartment for anywhere from 3 to 5 days based on the family's report who was found to have a stroke and the sequelae of being immobile on the floor including a large 8 x 12 sacral pressure ulcer total CK over 700, AKI with Cr 2.8 (baseline 0.7). Palliative care is consulted for goals of care.    I reviewed the patient's chart and discussed the case with Dr. Delayne.    I met with the patient, her son, and daughter-in-law in the patient's room.  The patient was sleepy but able to open her eyes and nod yes or no to a few questions before falling back to sleep.  I spent majority of the conversation just speaking with the patient's son and daughter-in-law.    I learned that the patient was independent prior to this event.  She was using a walker to ambulate but was still driving and taking care of the upkeep for her apartment on her own.    Overall, the patient's family expresses good understanding of the overall situation.  The patient had a stroke and was down for some time and is unclear how much recovery from her stroke will occur and how difficult will be for her to recover from this large sacral wound.  The family was at least able to share with me that the patient would want to be a DNR, but they are hopeful that she can make some improvements from her stroke and have a reasonable quality of life moving forward.  They agree to take the situation 1 day at a time and we will continue to follow along and support them.              Past Medical History:    Past Medical History:   Diagnosis Date    Hx of spinal cord  injury     Leaking of urine        Past Surgical History:    Past Surgical History:   Procedure Laterality Date    LITHOTRIPSY      TONSILLECTOMY AND ADENOIDECTOMY          Current Medications:    Current Facility-Administered Medications   Medication Dose Route Frequency Provider Last Rate Last Admin    lactated ringers  IV soln infusion   IntraVENous Continuous Delayne Dedrick Redbird, MD 150 mL/hr at 11/07/22 1026 New Bag at 11/07/22 1026    tuberculin injection 5 Units  5 Units IntraDERmal Once Floretta Gar Lesches, MD        aspirin  suppository 300 mg  300 mg Rectal Daily Floretta Gar Lesches, MD   300 mg at 11/07/22 9082    sodium chloride  flush 0.9 % injection 5-40 mL  5-40 mL IntraVENous 2 times per day Floretta Gar Lesches, MD   10 mL at 11/07/22 9081    sodium chloride  flush 0.9 % injection 5-40 mL  5-40 mL IntraVENous PRN Floretta Gar Lesches, MD        0.9 % sodium chloride  infusion   IntraVENous PRN  Floretta Gar Lesches, MD        ondansetron  (ZOFRAN -ODT) disintegrating tablet 4 mg  4 mg Oral Q6H PRN Floretta Gar Lesches, MD        Or    ondansetron  (ZOFRAN ) injection 4 mg  4 mg IntraVENous Q4H PRN Sullivan, Brigid Eileen, MD        bisacodyl  (DULCOLAX) EC tablet 5 mg  5 mg Oral Daily PRN Sullivan, Brigid Eileen, MD        [facility]) tablet 8.6 mg  1 tablet Oral Daily PRN Sullivan, Brigid Eileen, MD        aluminum & magnesium hydroxide-simethicone (MAALOX) 200-200-20 MG/5ML suspension 30 mL  30 mL Oral Q6H PRN Floretta Gar Lesches, MD        acetaminophen  (TYLENOL ) tablet 650 mg  650 mg Oral Q6H PRN Floretta Gar Lesches, MD        Or    acetaminophen  (TYLENOL ) suppository 650 mg  650 mg Rectal Q6H PRN Floretta Gar Lesches, MD        heparin  (porcine) injection 5,000 Units  5,000 Units SubCUTAneous 3 times per day Floretta Gar Lesches, MD   5,000 Units at 11/07/22 9381    piperacillin -tazobactam (ZOSYN ) 3,375 mg in sodium chloride  0.9 % 100 mL IVPB (mini-bag)  3,375 mg  IntraVENous Q12H Orville Devere HERO, [place] 25 mL/hr at 11/07/22 0727 3,375 mg at 11/07/22 0727    norepinephrine  (LEVOPHED ) 4 mg in sodium chloride  0.9 % 250 mL infusion  1-100 mcg/min IntraVENous Continuous Orville Devere HERO, PA 11.3 mL/hr at 11/07/22 0900 3 mcg/min at 11/07/22 0900        Allergies:  Patient has no known allergies.    Social History:    Social History     Socioeconomic History    Marital status: Divorced     Spouse name: Not on file    Number of children: Not on file    Years of education: Not on file    Highest education level: Not on file   Occupational History    Not on file   Tobacco Use    Smoking status: Never    Smokeless tobacco: Never   Substance and Sexual Activity    Alcohol use: Never    Drug use: Never    Sexual activity: Not Currently     Partners: Male   Other Topics Concern    Not on file   Social History Narrative    Not on file     Social Determinants of Health     Financial Resource Strain: Not on file   Food Insecurity: Not on file   Transportation Needs: Not on file   Physical Activity: Not on file   Stress: Not on file   Social Connections: Not on file   Intimate Partner Violence: Not on file   Housing Stability: Not on file        Family History:   @PFH @    REVIEW OF SYSTEMS/SYMPTOM ASSESSMENT:    The patient denies pain but was able to to nod her head yes to shortness of breath.  However, her oxygen is at 100% she has nasal cannula in place.  The patient quickly drifted off during my review of systems questions so it is unclear whether these answers are accurate.    Vitals:    BP 130/75   Pulse 92   Temp 97.9 F (36.6 C) (Bladder)   Resp 15   Ht 1.727 m (5' 8)   Wt  88 kg (194 lb 0.1 oz)   SpO2 100%   BMI 29.50 kg/m     PHYSICAL EXAM:    General Appearance: Critically ill-appearing, No apparent distress  HEENT: Evident right facial droop, atraumatic, non icteric sclera  Pulmonary/Chest: Clear to auscultation bilaterally, no wheezes, rhonchi, rales  Cardiovascular: S1, S2,  regular rate and rhythm, No murmurs, rubs, or gallops  Abdomen: Soft, non tender, non distended, normoactive bowel sounds      DATA:    CBC:   Recent Labs     11/06/22  1149 11/06/22  2022 11/07/22  0347   WBC 21.7* 20.0* 14.9*   HGB 14.3 11.5 11.1*   PLT 280 229 246     Last 3 CMP:   Recent Labs     11/06/22  1149 11/06/22  2022 11/07/22  0347   NA 145 147* 147*   K 4.0 3.7 3.8   CL 101 111* 110*   CO2 23 21* 20*   BUN 182* 169* 169*   CREATININE 2.8* 2.5* 2.8*   GLUCOSE 161* 143* 167*   CALCIUM  9.5 8.2* 8.4*   PROT 7.1 5.0* 5.0*   LABALBU 3.2* 2.6* 2.5*   BILITOT 1.02 0.78 0.67   ALKPHOS 106 80 79   AST 87* 73* 69*   ALT 82* 63* 58*        IMAGING:  XR CHEST PORTABLE  Narrative: CHEST:   AP (or PA),  11/06/2022 7:24 PM    INDICATION: line placement      COMPARISON: Earlier same day    FINDINGS: Right IJ central line is seen with its tip over superior vena cava. No   pneumothorax is appreciated.    Cardiac silhouette is probably not enlarged allowing for poor inspiration.   Minimal curvilinear atelectasis or scarring in the left lung base again seen. No   pleural fluid.  Impression: Placement of right IJ central line with its tip in SVC and no pneumothorax   appreciated.  XR CHEST PORTABLE  Narrative: Single View Chest: 11/06/22,17:20    History:  S20.429A,Blister (nonthermal) of unspecified back wall of thorax,   initial encounter,ICD-10-CM  decreased sat/ possible fluid over;load    Comparison: 11/06/2022, 11:43    Findings: Lung volumes are lower. Pulmonary vascularity and reticulonodular   opacities are accentuated. Mild cardiomegaly is present with left ventricular   prominence. Patient is rotated and tilted to the left.   Impression: Impression:Pulmonary venous hypertension.  MRA NECK W CONTRAST  Narrative: MRA Neck with gadolinium 11/06/22    COMPARISON: None    INDICATION: cva,    TECHNIQUE: Gadolinium enhanced 3-D time-of-flight MR angiography of the neck    FINDINGS:  Use of NASCET Criteria    RIGHT ICA:  Right ICA origin shows moderate narrowing, likely not exceeding   30-40%.  LEFT ICA: Left ICA origin suggest no significant stenosis, favor mild narrowing.   Small opacified structure at the proximal ICA, cannot exclude ulcerated plaque  VERTEBRAL ARTERIES: Vertebral arteries are widely patent in their mid and distal   segments, origins are not well delineated   AORTIC ARCH: Arch is limited, proximal brachial cephalic vessels are not well   visualized, difficult to differentiate anatomic from artifact  Impression: 1. MRA neck: Limited by excessive motion artifact  Right ICA, mild to moderate narrowing, likely not exceeding 30-40%  Left ICA, mild narrowing at the origin, small vascular structure near the   bifurcation,  Cannot exclude 5 mm ulcerated plaque  Vertebral arteries are bilaterally patent, proximal segments, origins, as well   as the origins of the other brachiocephalic vessels are not well visualized,   cannot differentiate artifact from anatomic  MRA HEAD WO CONTRAST  Narrative: MRI and MRA Brain without contrast: 11/06/22    CLINICAL INDICATION:  CVA. Found on floor, right-sided facial droop, right-sided   weakness with slurred speech    TECHNIQUE: Sagittal and axial spin-echo T1-weighted, axial fast spin-echo   T2-weighted, FLAIR, diffusion, and gradient echo imaging.Coronal diffusion. 3-D   time-of-flight MR angiography of the brain    COMPARISON: CT scan November 06, 2022.    FINDINGS:     MRI BRAIN:  No mass or shift of midline structure. Acute left para midline pontine infarct,   14 mm in diameter. Small acute infarct anterior inferior medial right   cerebellum, 7 mm. Small acute/subacute lacunar infarct left frontal white   matter. Severe underlying small vessel change. Generalized atrophy with   prominent temporal lobe involvement.  The major intracranial vessels are patent.   The paranasal sinuses and mastoid air cells are clear. Orbital structures are   normal. Craniocervical junction  is normal.     MRA BRAIN:  Distal internal carotid arteries are bilaterally patent. Anterior and middle   cerebral arteries are patent. Focal moderate stenosis left P2, severe stenosis   left ACA anterior to the genu. The distal vertebral basilar arteries are patent.   Severe stenosis of both posterior cerebral arteries, on the right both the   proximal and distal right P2, on the left mid P2 facet.  Impression: 1. MRI brain  14 mm acute perforator infarct left pons  7 mm acute infarct right anterior inferior cerebellum  4 mm acute/subacute lacunar infarct left frontal, periventricular white matter  No hemorrhage, severe underlying small vessel change  Generalized atrophy with prominent temporal lobe involvement    2. MRA brain  No large vessel occlusion.  Left ACA, severe stenosis  Left M2, moderate stenosis  Right P2, severe stenosis proximal and distal segment  Left P2, severe stenosis mid segment  MRI BRAIN WO CONTRAST  Narrative: MRI and MRA Brain without contrast: 11/06/22    CLINICAL INDICATION:  CVA. Found on floor, right-sided facial droop, right-sided   weakness with slurred speech    TECHNIQUE: Sagittal and axial spin-echo T1-weighted, axial fast spin-echo   T2-weighted, FLAIR, diffusion, and gradient echo imaging.Coronal diffusion. 3-D   time-of-flight MR angiography of the brain    COMPARISON: CT scan November 06, 2022.    FINDINGS:     MRI BRAIN:  No mass or shift of midline structure. Acute left para midline pontine infarct,   14 mm in diameter. Small acute infarct anterior inferior medial right   cerebellum, 7 mm. Small acute/subacute lacunar infarct left frontal white   matter. Severe underlying small vessel change. Generalized atrophy with   prominent temporal lobe involvement.  The major intracranial vessels are patent.   The paranasal sinuses and mastoid air cells are clear. Orbital structures are   normal. Craniocervical junction is normal.     MRA BRAIN:  Distal internal carotid arteries are  bilaterally patent. Anterior and middle   cerebral arteries are patent. Focal moderate stenosis left P2, severe stenosis   left ACA anterior to the genu. The distal vertebral basilar arteries are patent.   Severe stenosis of both posterior cerebral arteries, on the right both the   proximal and distal right P2,  on the left mid P2 facet.  Impression: 1. MRI brain  14 mm acute perforator infarct left pons  7 mm acute infarct right anterior inferior cerebellum  4 mm acute/subacute lacunar infarct left frontal, periventricular white matter  No hemorrhage, severe underlying small vessel change  Generalized atrophy with prominent temporal lobe involvement    2. MRA brain  No large vessel occlusion.  Left ACA, severe stenosis  Left M2, moderate stenosis  Right P2, severe stenosis proximal and distal segment  Left P2, severe stenosis mid segment  CT ABDOMEN PELVIS WO CONTRAST Additional Contrast? None  Narrative: CT abdomen pelvis without contrast: 11/06/22    INDICATION: low back wound N17.9,Acute kidney failure, unspecified,ICD-10-CM    COMPARISON:  None     TECHNIQUE: Routine noncontrast protocol (Axial imaging from the lung bases to   the pubic symphysis with coronal reconstruction.) CT scanning was performed   using radiation dose reduction techniques when appropriate, per system   protocols.    FINDINGS:     Evaluation of the viscera and vasculature is diminished without the use of IV   contrast..    Lower thorax: No pleural or pericardial effusion.  Partially imaged coronary   artery calcifications. Mitral annular calcification. Bibasilar atelectasis.   Small hiatal hernia.  Liver and Biliary:  Liver normal size and contour.   No biliary dilatation.  No   gallbladder wall thickening or pericholecystic fluid. No radiopaque gallstones.   Pancreas:  Pancreas appears atrophic with some scattered mild calcifications   likely sequela of prior pancreatitis without CT evidence of acute pancreatitis   at this time.   Spleen:   Normal.   Adrenal Glands:  Normal.    Urinary system:  No hydronephrosis. 9 mm nonobstructing right kidney stone.   Otherwise unremarkable noncontrast appearance the right kidney. Mild diffuse   atrophy and scattered cortical scarring left kidney.  2 mm stone in the urinary   bladder. Urinary bladder decompressed by Foley catheter.    Pelvic reproductive organs:  Unremarkable.     Peritoneal cavity:  No free air or fluid.    Vascular:  No aortic aneurysm.  Moderate/severe atherosclerosis.  Lymph nodes:  None enlarged.    Gastrointestinal:  No bowel obstruction.   Appendix not seen, but there is no   pericecal inflammatory change to suggest appendicitis.   Colonic diverticulosis   without diverticulitis .  Partially imaged tubing terminates in the lower   rectum.  Bones: No definite acute bony findings. Bones appear osteopenic. There are   multilevel compression deformities of the lumbar spine and at T12,   age-indeterminate but favored to be chronic. Multilevel degenerative changes of   the spine.  Additional findings, if any: Pelvic floor relaxation..    Impression: 1. No definite acute findings by noncontrast technique.    2. 9 mm nonobstructing right kidney stone. 2 mm stone urinary bladder. Urinary   bladder decompressed by Foley catheter.  3. Pelvic floor relaxation.  4. Bones appear osteopenic. Multilevel compression deformities lumbar spine and   at T12 are technically age-indeterminate but favored to be chronic. Correlate   for point tenderness.  CT Head W/O Contrast  Narrative: CT head without: 11/06/22    INDICATION: AMS N17.9,Acute kidney failure, unspecified,ICD-10-CM    COMPARISON: None    TECHNIQUE: Noncontrast axial imaging from foramen magnum to the vertex. CT   scanning was performed using radiation dose reduction techniques when   appropriate, per system protocols.    FINDINGS:  No intra-axial or extra axial hemorrhage. No extra-axial fluid collection. No   intra-axial mass effect or midline  shift.     Bilateral cerebellar lacunar infarcts. Elsewhere Preservation of gray-white   differentiation. Mild atrophy with increased sulcation and ex-vacuo dilatation   of the ventricles. No hydrocephalus.     Moderate/severe white matter hypodensities, predominantly periventricular,   nonspecific but are likely chronic microangiopathic ischemic changes.   Suprasellar and quadrigeminal plate cisterns are patent, without herniation.    No soft tissue abnormality, fracture or osseous lesion. Visualized portions of   the orbits are normal. Included paranasal sinuses and mastoid air cells are   predominantly clear.  Impression: No acute intracranial abnormality.   Chronic findings as above.  XR CHEST PORTABLE  Narrative: Chest AP: 11/06/22    INDICATION: Altered Mental Status.     COMPARISON: None available for review.    FINDINGS:     Lungs/pleura: Consolidative opacities in the right midlung and left lung base.   No pleural effusion or pneumothorax.    Cardiomediastinum: Unremarkable.    Bones/ Soft tissues: No acute osseous abnormality.  Impression: Consolidative opacities in the right midlung and left lung base. Findings could   reflect pneumonia in the appropriate clinical context, however underlying nodule   not excluded. At a minimum, recommend short-term follow-up two-view chest   radiograph in 4-6 weeks for further evaluation.       Assessment/ Plan   1. Stenosis of left carotid artery    2. Altered mental status, unspecified altered mental status type    3. Pneumonia of both lungs due to infectious organism, unspecified part of lung    4. AKI (acute kidney injury) (HCC)    5. Dehydration    6. Elevated troponin    7. Traumatic blister of back    8. Privation as cause of accidental injury, initial encounter    9. Other place in apartment as the place of occurrence of the external cause    The patient is an 87 year old woman with aortic stenosis, left carotid stenosis who presented after being found down in her  apartment for anywhere from 3 to 5 days based on the family's report who was found to have a clinical presentation of a stroke and the sequelae of being immobile on the floor including a large 8 x 12 sacral pressure ulcer total CK over 700, AKI with Cr 2.8 (baseline 0.7). Palliative care is consulted for goals of care.      1. Goals of Care:  DNR, discussed  Son is surrogate decision maker, there are 2 other siblings who live out of state  No advanced directives on file    Overall, the patient's family expresses good understanding of the overall situation.  The patient had a stroke in was down for some time and we are dealing with the consequences of this including the weakness from the stroke, the sacral wound, the acute kidney injury, and her nutritional issues related to swallowing and the family understands all of this.  They are hopeful for recovery but agree the patient would not want to undergo any heroic measures and are agreeable to a DNR CODE STATUS.    I discussed the case with the Dr. Delayne.    This patient's case involved high level medical decision making including a severe problem that has a severe exacerbation, progression, or side effects of treatment or is life threatening with extensive review of patient's case interpreting labs, vitals, imaging and talking with  the patient, her son, daughter-in-law and ICU physician and the decisions reagarding goals of care and code status is of high risk.

## 2022-11-07 NOTE — Progress Notes (Signed)
 Pharmacy Progress Note    SUBJECTIVE     REASON FOR CONSULT: Dosing and monitoring of vancomycin .  INDICATION: GPC bacteremia of suspected SSTI source  Treatment day: 1  ------------------------------------------------------------------------------------------------------------------  OBJECTIVE   Admission weight: 88 kg (194 lb 0.1 oz)   Ideal body weight: 64.2 kg (141 lb 6.9 oz)  Adjusted ideal body weight: 73.7 kg (162 lb 7.3 oz)     Past Medical History:   Diagnosis Date    Hx of spinal cord injury     Leaking of urine        Pertinent Cultures:   3/7 blood aerobic, peripheral: GPC  3/7 blood anaerobic, peripheral: GPC  -------------------------------------------------------------------------------------------------------------------  ASSESSMENT / PLAN     Ashley Davies  is a 87 y.o. female receiving vancomycin  for the treatment of GPC bacteremia of suspected SSTI source.    Current drug regimen: new start  Recent levels: no recent levels  Renal function:  AKI - Current Scr 2.8 mg/dL unchanged from 2.8 mg/dL (baseline SCr 0.7 mg/dL); Estimated Creatinine Clearance: 17 mL/min (A) (based on SCr of 2.8 mg/dL (H)).. 24 hr UOP less than 0.5 mL/kg/hr.  Goal levels: AUC 400-600, trough >10 mcg/mL  New drug regimen: vancomycin  1500 mg x1 dose. Dose per random levels in setting of dynamic renal function with expected poor clearance.  F/U monitoring and levels: A Random Level is ordered for 3/9 with AM labs. A clinical pharmacist will continue to monitor drug levels and renal function, and make dose adjustments as appropriate.  Expected duration of therapy: to be determined by clinical course.    Thank you for allowing me to participate in the care of this patient. Please contact your Clinical Pharmacist with any questions or concerns.    Adina Flatness, PharmD

## 2022-11-08 LAB — CBC
Hematocrit: 31.8 % — ABNORMAL LOW (ref 34.0–47.0)
Hemoglobin: 9.7 g/dL — ABNORMAL LOW (ref 11.5–15.7)
MCH: 28.5 pg (ref 27.0–34.5)
MCHC: 30.5 g/dL — ABNORMAL LOW (ref 32.0–36.0)
MCV: 93.5 fL (ref 81.0–99.0)
MPV: 11.2 fL (ref 7.2–13.2)
NRBC Absolute: 0 10*3/uL (ref 0.000–0.012)
NRBC Automated: 0 % (ref 0.0–0.2)
Platelets: 200 10*3/uL (ref 140–440)
RBC: 3.4 x10e6/mcL — ABNORMAL LOW (ref 3.60–5.20)
RDW: 14.6 % (ref 11.0–16.0)
WBC: 10.5 10*3/uL (ref 3.8–10.6)

## 2022-11-08 LAB — COMPREHENSIVE METABOLIC PANEL
ALT: 39 U/L — ABNORMAL HIGH (ref 0–35)
AST: 42 U/L — ABNORMAL HIGH (ref 0–35)
Albumin/Globulin Ratio: 0.9 — ABNORMAL LOW (ref 1.00–2.70)
Albumin: 2.3 g/dL — ABNORMAL LOW (ref 3.5–5.2)
Alk Phosphatase: 63 U/L (ref 35–117)
Anion Gap: 10 mmol/L (ref 2–17)
BUN: 150 mg/dL — ABNORMAL HIGH (ref 8–23)
CALCIUM,CORRECTED,CCA: 9.6 mg/dL (ref 8.8–10.2)
CO2: 23 mmol/L (ref 22–29)
Calcium: 8.2 mg/dL — ABNORMAL LOW (ref 8.8–10.2)
Chloride: 119 mmol/L — ABNORMAL HIGH (ref 98–107)
Creatinine: 2.5 mg/dL — ABNORMAL HIGH (ref 0.5–1.0)
Est, Glom Filt Rate: 18 mL/min/1.73m — ABNORMAL LOW (ref >=60)
Globulin: 2.7 g/dL (ref 1.9–4.4)
Glucose: 145 mg/dL — ABNORMAL HIGH (ref 70–99)
OSMOLALITY CALCULATED: 351 mOsm/kg — ABNORMAL HIGH (ref 270–287)
Potassium: 3.5 mmol/L (ref 3.5–5.3)
Sodium: 152 mmol/L — ABNORMAL HIGH (ref 135–145)
Total Bilirubin: 0.35 mg/dL (ref 0.00–1.20)
Total Protein: 5 g/dL — ABNORMAL LOW (ref 6.4–8.3)

## 2022-11-08 LAB — BASIC METABOLIC PANEL W/ REFLEX TO MG FOR LOW K
Anion Gap: 12 mmol/L (ref 2–17)
Anion Gap: 11 mmol/L (ref 2–17)
BUN: 154 mg/dL — ABNORMAL HIGH (ref 8–23)
BUN: 134 mg/dL — ABNORMAL HIGH (ref 8–23)
CO2: 22 mmol/L (ref 22–29)
CO2: 23 mmol/L (ref 22–29)
Calcium: 8.2 mg/dL — ABNORMAL LOW (ref 8.8–10.2)
Calcium: 8.2 mg/dL — ABNORMAL LOW (ref 8.8–10.2)
Chloride: 115 mmol/L — ABNORMAL HIGH (ref 98–107)
Chloride: 118 mmol/L — ABNORMAL HIGH (ref 98–107)
Creatinine: 2.4 mg/dL — ABNORMAL HIGH (ref 0.5–1.0)
Creatinine: 2.3 mg/dL — ABNORMAL HIGH (ref 0.5–1.0)
Est, Glom Filt Rate: 19 mL/min/1.73m — ABNORMAL LOW (ref >=60)
Est, Glom Filt Rate: 20 mL/min/1.73m — ABNORMAL LOW (ref >=60)
Glucose: 152 mg/dL — ABNORMAL HIGH (ref 70–99)
Glucose: 131 mg/dL — ABNORMAL HIGH (ref 70–99)
OSMOLALITY CALCULATED: 345 mOsm/kg — ABNORMAL HIGH (ref 270–287)
OSMOLALITY CALCULATED: 343 mOsm/kg — ABNORMAL HIGH (ref 270–287)
Potassium: 3.3 mmol/L — ABNORMAL LOW (ref 3.5–5.3)
Potassium: 3.6 mmol/L (ref 3.5–5.3)
Sodium: 149 mmol/L — ABNORMAL HIGH (ref 135–145)
Sodium: 152 mmol/L — ABNORMAL HIGH (ref 135–145)

## 2022-11-08 LAB — CULTURE, BLOOD 1

## 2022-11-08 LAB — HEMOGLOBIN A1C
Est. Avg. Glucose, WB: 126
Est. Avg. Glucose-calculated: 136
Hemoglobin A1C: 6 % (ref 4.0–6.0)

## 2022-11-08 LAB — VITAMIN B12: Vitamin B-12: 1322 pg/mL — ABNORMAL HIGH (ref 232–1245)

## 2022-11-08 LAB — EKG 12-LEAD
P Axis: 50 degrees
P-R Interval: 160 ms
Q-T Interval: 416 ms
QRS Duration: 148 ms
QTc Calculation (Bazett): 462 ms
R Axis: -52 degrees
T Axis: 137 degrees
Ventricular Rate: 89 {beats}/min

## 2022-11-08 LAB — MAGNESIUM
Magnesium: 2.8 mg/dL — ABNORMAL HIGH (ref 1.6–2.6)
Magnesium: 2.7 mg/dL — ABNORMAL HIGH (ref 1.6–2.6)

## 2022-11-08 LAB — PHOSPHORUS: Phosphorus: 4.2 mg/dL (ref 2.5–4.5)

## 2022-11-08 LAB — TSH WITH REFLEX: TSH: 1.84 mcIU/mL (ref 0.358–3.740)

## 2022-11-08 LAB — POCT GLUCOSE: POC Glucose: 137 mg/dL — ABNORMAL HIGH (ref 65.0–110.0)

## 2022-11-08 LAB — FOLATE: Folate: 16.3 ng/mL (ref 4.80–24.20)

## 2022-11-08 LAB — MRSA BY PCR: MRSA SCREEN RT-PCR: NOT DETECTED

## 2022-11-08 LAB — VANCOMYCIN LEVEL, RANDOM: Vancomycin Rm: 15.2 ug/mL (ref 10.0–40.0)

## 2022-11-08 MED ORDER — VANCOMYCIN HCL IN NACL 1.25-0.9 GM/250ML-% IV SOLN
Freq: Once | INTRAVENOUS | Status: AC
Start: 2022-11-08 — End: 2022-11-08
  Administered 2022-11-08: 19:00:00 1250 mg via INTRAVENOUS

## 2022-11-08 MED ORDER — ASPIRIN 81 MG PO CHEW
81 MG | Freq: Every day | ORAL | Status: DC
Start: 2022-11-08 — End: 2022-11-11
  Administered 2022-11-08 – 2022-11-09 (×2): 81 mg via NASOGASTRIC

## 2022-11-08 MED ORDER — TRAMADOL HCL 50 MG PO TABS
50 MG | Freq: Four times a day (QID) | ORAL | Status: DC | PRN
Start: 2022-11-08 — End: 2022-11-11
  Administered 2022-11-08 – 2022-11-11 (×10): 50 mg via ORAL

## 2022-11-08 MED FILL — TRAMADOL HCL 50 MG PO TABS: 50 MG | ORAL | Qty: 1

## 2022-11-08 MED FILL — NORMAL SALINE FLUSH 0.9 % IV SOLN: 0.9 % | INTRAVENOUS | Qty: 10

## 2022-11-08 MED FILL — HEPARIN SODIUM (PORCINE) 5000 UNIT/ML IJ SOLN: 5000 UNIT/ML | INTRAMUSCULAR | Qty: 1

## 2022-11-08 MED FILL — PIPERACILLIN SOD-TAZOBACTAM SO 3.375 (3-0.375) G IV SOLR: 3.375 (3-0.375) g | INTRAVENOUS | Qty: 3375

## 2022-11-08 MED FILL — ASPIRIN 81 MG PO CHEW: 81 MG | ORAL | Qty: 1

## 2022-11-08 MED FILL — VANCOMYCIN HCL IN NACL 1.25-0.9 GM/250ML-% IV SOLN: INTRAVENOUS | Qty: 250

## 2022-11-08 NOTE — Progress Notes (Signed)
Nutrition Brief Note: TF monitoring     Pt tolerating TF at start rate, hypernatremia worsening. PA increased FWF to 40m/h this morning, LR still infusing at 1037mh. Will continue to adjust FWF as needed to address deficit. Levo off.     Nutrition Recommendations/Plan:   NPO  Enteral Nutrition, adjust FWF: Continue Vital AF 1.2 via NEFT at 1536mr with 66m52m FWF for hypernatremia - 1.8L free water daily from flushes   -If pt tolerates and electrolytes WNL, advance 10ml54mr to goal rate of Vital AF 1.2 at 55ml/40mith 20ml/h57mF+Juven BID.  -This formula is to provide 1764kcal (99%EEN), 103g pro (97%EPN), 147g carb, and 1069ml of46mer.  -Adjust fluid as needed. Total fluid provision: 1069+480=1549ml of 82mr daily.  *FWD: 3.8L (Currently being addressed with LR at 100ml/hr a65mncrease FWF).  3. Rec MVI, Thiamine in the setting of malnutrition. Pt at risk for refeeding, replace electrolytes as needed.    Lab Results   Component Value Date/Time    NA 152 11/08/2022 03:46 AM    K 3.5 11/08/2022 03:46 AM    CL 119 11/08/2022 03:46 AM    CO2 23 11/08/2022 03:46 AM    BUN 150 11/08/2022 03:46 AM    CREATININE 2.5 11/08/2022 03:46 AM    GLUCOSE 145 11/08/2022 03:46 AM    CALCIUM 8.2 11/08/2022 03:46 AM    PHOS 4.2 11/08/2022 03:46 AM    MG 2.7 11/08/2022 03:46 AM     Lab Results   Component Value Date/Time    TRIG 129 02/03/2022 12:12 PM     Lab Results   Component Value Date/Time    POCGLU 123.0 11/06/2022 05:18 PM    POCGLU 129.0 11/06/2022 04:44 PM      Past Medical History:   Diagnosis Date    Hx of spinal cord injury     Leaking of urine        aspirin  81 mg Per NG tube Daily    sodium chloride flush  5-40 mL IntraVENous 2 times per day    heparin (porcine)  5,000 Units SubCUTAneous 3 times per day    piperacillin-tazobactam  3,375 mg IntraVENous Q12H     Estimated Daily Nutrient Needs:  Energy Requirements Based On: Formula  Weight Used for Energy Requirements: Current  Energy (kcal/day): 1789kcal  (MSJX1.3)  Weight Used for Protein Requirements: Current  Protein (g/day): 106-132g (1.2-1.5g/kg)  CHO: 224g (50%EEN)  Method Used for Fluid Requirements: 1 ml/kcal  Fluid (ml/day): 1789ml (1ml/84m) o39mer MD.    Will continue to follow per dept policies. Please c/s prn if needed     Braylan Faul O'Esther Hardy   [phone] for allowing me to participate in the care of this patient.   Please contact your Registered Dietitian with any questions or concerns.      [place]: [phone] your clinical nutrition team via [facility]

## 2022-11-08 NOTE — Progress Notes (Signed)
11/08/22 1630   Encounter Summary   Encounter Overview/Reason  Follow-up;Spiritual/Emotional Needs   Service Provided For: Patient;Family   Referral/Consult From: Family   Support System Family members   Last Encounter  11/08/22   Complexity of Encounter Moderate   Begin Time 1600   End Time  1620   Total Time Calculated 20 min   Rituals, Rites and Sacraments   Type Blessings   Grief, Loss, and Adjustments   Type Adjustment to illness   Assessment/Intervention/Outcome   Assessment Coping;Interrupted family processes   Intervention Active listening;Discussed belief system/religious practices/faith;Discussed illness injury and it's impact;Explored/Affirmed feelings, thoughts, concerns;Explored Coping Skills/Resources;Prayer (assurance of)/Blessing;Sustaining Presence/Ministry of presence   Outcome Acceptance;Encouraged;Engaged in conversation   Plan and Referrals   Plan/Referrals Continue to visit, (comment)     Pt was still only minimally responsive, but son and daughter in law were there visiting and warmly welcomed the Chaplain.  We discussed their necessary travel to see Ms [place], her endurance for survival after her stroke until care arrived, and their understanding for future steps that can only be faced one at a time. They seem to be working for cooperative family dynamics as they await what comes next. We concluded our visit holding hands with prayers of thanksgiving for hands that serve, hope and peace for the hearts that love, and healing according to God's will.

## 2022-11-08 NOTE — Progress Notes (Signed)
11/08/22 2159   Encounter Summary   Encounter Overview/Reason  Follow-up   Service Provided For: Family   Referral/Consult From: Family   Last Encounter  11/08/22   Complexity of Encounter Moderate   Begin Time 2000   End Time  2050   Total Time Calculated 50 min   Spiritual/Emotional needs   Type Emotional Distress   Assessment/Intervention/Outcome   Assessment Compromised coping;Decisional conflict;Desire for reconciliation;Interrupted family processes   Intervention Active listening;Discussed belief system/religious practices/faith;Discussed illness injury and it's impact;Empowerment;Explored/Affirmed feelings, thoughts, concerns;Explored Coping Skills/Resources;Life review/Legacy;Sustaining Presence/Ministry of presence   Outcome Acceptance;Comfort;Deescalated;Encouraged;Engaged in conversation;Expressed feelings, needs, and concerns;Grieving;Less anxious, Less agitated;Receptive;Venting emotion   Plan and Referrals   Plan/Referrals Continue Support (comment)  (Family Dynamics will need future support)     There are complex family dynamics in play, with an ultimate desire for cooperation regarding future care.  This family may benefit from future support and counsel with chaplains, both individually and as a group if possible, and it may also be helpful if social services becomes involved to help the family, [place] of [place] of [place], understand options available.

## 2022-11-08 NOTE — Video Visit Notes (Signed)
Inpatient Teleneurology Consult Note            Date:11/07/2022        Patient Name:Ashley Davies     Date of Birth:07/21/1936     Age:87 y.o.    Patient Location:   [facility]  [address]  [place] [address]  Dept: [phone]  Dept Fax: [phone]  Loc: [phone]     Telemedicine Provider Location:  Remote. Home office.    Chief Complaint     Chief Complaint   Patient presents with    Altered Mental Status     Pt is brought by ems from home. Pt lives by herself and per ems she has been on the floor for at least 3 days. Pt has R sided facial droop, R sided weakness and slurred speech. Family tried to call her 3 days ago but hasn't heard anything. Pt soiled in her feces.         History Obtained From   Son, granddaughter and chart    History of Present Illness   87yo female with history of aortic valve stenosis, left carotid stenosis who was found down on the floor of her apartment after family did not hear from her. Family had not heard from her for at least one week.    Granddaughter is at bedside. States that from what she understands there were several days that patient was down on the ground. She was found by someone who worked at the complex who came to check on her on Thursday.    At baseline, she was using a walker. She was able to drive. Living independently. At the same time, patient may have missed some doctor's appointments. She was starting to show signs of needing help.     She is not on any anticoagulation or antiplatelets at home.    Past Medical History     Past Medical History:   Diagnosis Date    Hx of spinal cord injury     Leaking of urine         Past Surgical History     Past Surgical History:   Procedure Laterality Date    LITHOTRIPSY      TONSILLECTOMY AND ADENOIDECTOMY          Medications     Prior to Admission medications    Medication Sig Start Date End Date Taking? Authorizing Provider   Mirabegron (MYRBETRIQ PO) Take by mouth     [provider]   psyllium 0.52 g capsule Take 2 capsules by mouth in the morning and at bedtime 02/11/22   Darlina Guys, APRN - NP        INPATIENT  lactated ringers IV soln infusion, Continuous  tuberculin injection 5 Units, Once  aspirin suppository 300 mg, Daily  sodium chloride flush 0.9 % injection 5-40 mL, 2 times per day  sodium chloride flush 0.9 % injection 5-40 mL, PRN  0.9 % sodium chloride infusion, PRN  ondansetron (ZOFRAN-ODT) disintegrating tablet 4 mg, Q6H PRN   Or  ondansetron (ZOFRAN) injection 4 mg, Q4H PRN  bisacodyl (DULCOLAX) EC tablet 5 mg, Daily PRN  senna (SENOKOT) tablet 8.6 mg, Daily PRN  aluminum & magnesium hydroxide-simethicone (MAALOX) 200-200-20 MG/5ML suspension 30 mL, Q6H PRN  acetaminophen (TYLENOL) tablet 650 mg, Q6H PRN   Or  acetaminophen (TYLENOL) suppository 650 mg, Q6H PRN  heparin (porcine) injection 5,000 Units, 3 times per day  piperacillin-tazobactam (ZOSYN) 3,375  mg in sodium chloride 0.9 % 100 mL IVPB (mini-bag), Q12H  norepinephrine (LEVOPHED) 4 mg in sodium chloride 0.9 % 250 mL infusion, Continuous      Allergies   Patient has no known allergies.    Social History   Denies tobacco, EtOH. Lives independently in apartment complex.    Family History     Family History   Problem Relation Age of Onset    Diabetes Mother     No Known Problems Father     Cancer Brother         Throat Cancer    Alzheimer's Disease Brother     Diabetes Paternal Grandfather        Review of Systems   ROS as per HPI, otherwise negative    Physical Exam   BP (!) 125/57   Pulse 89   Temp 97.9 F (36.6 C) (Bladder)   Resp 15   Ht 1.73 m (5' 8.11")   Wt 88 kg (194 lb)   SpO2 100%   BMI 29.40 kg/m      Gen: WD/WN, NAD  HEENT: NCAT, sclerae anicteric, OP clear, MMM; Dobhoff present  Neck: trachea midline    NEURO EXAM    Mental Status:  Arousable by mild stimulus, oriented x 2 (person, place). Not time (July). Age incorrect (78). Becomes somnolent towards the end of the  examination.   Follows all commands  Speech fluent, but slow. Mild-mod dysarthria. Low tone voice.    Cranial Nerves:  Funduscopic exam UTA  PERRLA  EOMI  VFF  Right partial facial weakness, sensation intact  Hearing intact bilaterally  Tongue and palate midline  Shoulder shrug symmetric    Motor:  RUE plegic. RLE plegic.  LUE drift. LLE no antigravity    Sensation:  Intact to LT bilaterally  No visual or tactile extinction    Cerebellar:  No ataxia    Gait:  Deferred for patient safety      NIHSS 18    Pre stroke mRS 4    Labs      CBC:  Recent Labs     11/06/22  1149 11/06/22  2022 11/07/22  0347   WBC 21.7* 20.0* 14.9*   RBC 4.96 3.93 3.83   HGB 14.3 11.5 11.1*   HCT 44.7 36.2 35.3   MCV 90.1 92.1 92.2   RDW 14.0 14.0 14.5   PLT 280 229 246     CHEMISTRIES:  Recent Labs     11/06/22  1149 11/06/22  2022 11/07/22  0347   NA 145 147* 147*   K 4.0 3.7 3.8   CL 101 111* 110*   CO2 23 21* 20*   BUN 182* 169* 169*   CREATININE 2.8* 2.5* 2.8*   GLUCOSE 161* 143* 167*   MG  --   --  2.7*     LIVER PROFILE:  Recent Labs     11/06/22  1149 11/06/22  2022 11/07/22  0347   AST 87* 73* 69*   ALT 82* 63* 58*   BILITOT 1.02 0.78 0.67   ALKPHOS 106 80 79     A1c:  Recent Labs     11/06/22  1644 11/06/22  1718   POCGLU 129.0* 123.0*     MRI brain and MRA head and neck:  1. MRI brain  14 mm acute perforator infarct left pons  7 mm acute infarct right anterior inferior cerebellum  4 mm acute/subacute lacunar infarct left frontal, periventricular white matter  No hemorrhage, severe underlying small vessel change  Generalized atrophy with prominent temporal lobe involvement     2. MRA brain  No large vessel occlusion.  Left ACA, severe stenosis  Left M2, moderate stenosis  Right P2, severe stenosis proximal and distal segment  Left P2, severe stenosis mid segment    3. MRA neck: Limited by excessive motion artifact  Right ICA, mild to moderate narrowing, likely not exceeding 30-40%  Left ICA, mild narrowing at the origin, small  vascular structure near the   bifurcation, cannot exclude 5 mm ulcerated plaque  Vertebral arteries are bilaterally patent, proximal segments, origins, as well   as the origins of the other brachiocephalic vessels are not well visualized,   cannot differentiate artifact from anatomic     Assessment / Plan   Acute ischemic stroke affecting multiple vascular territories with dysarthria and right hemiplegia  - Suspect embolic etiology even though patient also has intracranial atherosclerosis  - Stroke labs: TSH, b12, folate, A1c, lipid profile  - BP goal normotension  - BS goal 140-180  - LDL goal < 70  - High intensity statin (once liver enzymes have normalized)  - ASA '81mg'$  daily for secondary stroke prevention  - Telemetry (concern for pAF)  - TTE: EF 65-70% / LA mildly dilated / moderate stenosis AV, severe annular calcification MV / no wall motion abnormalities  - PT/OT/ST consultations    Those present at time of consultation include:   Patient, patient's granddaughter and son, bedside RN    Time to complete management: 85 minutes.     This patient was evaluated through a synchronous (real-time) audio-video encounter. The patient (and/or guardian if applicable) is aware that this is a billable service, which includes applicable co-pays. This virtual visit was conducted with patient's (and/or legal guardian's) consent. Patient identification was verified, and a caregiver was present when appropriate.    Electronically signed by Louanne Belton, MD on 11/07/22 at 11:21 AM EST

## 2022-11-08 NOTE — Progress Notes (Signed)
ICU Progress Note    Date:11/08/2022       Room:5003/01  Patient Name:Ashley Davies     Date of Birth:1936/06/30     Age:87 y.o.    Subjective   Interval History Status: improved.     Complains of back pain    Daily Summary:  3/7: Admitted to hospitalist service after being found down at home. Right hemiplegia and right facial droop. 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 AKI and sacral decubitus ulcer. CK 700s, IVF, tx to ICU due to hypotension, hypoxemia. CVC placed. IVC flat, IVF continued, Zosyn  3/8: Creat increased to 2.8, 495 cc UOP overnight, levophed at 5 mics, 1L 0.45NS, palliative care consultation, General Surgery consultation for sacral decubitus wound with eschar and fluctuance. Palliative care discussion with family. Code status changed to DNR. NEFT placed after failed swallow study  3/9: Hypernatremic this a.m. Na 152, FWF increased. BUN 150, creat 2.5. Patient without complaints. Right facial droop with Right hemiplegia. Levophed off 3/8    Review of Systems     ROS as previously reviewed     Medications   Scheduled Meds:    aspirin  300 mg Rectal Daily    sodium chloride flush  5-40 mL IntraVENous 2 times per day    heparin (porcine)  5,000 Units SubCUTAneous 3 times per day    piperacillin-tazobactam  3,375 mg IntraVENous Q12H     Continuous Infusions:    lactated ringers IV soln 150 mL/hr at 11/08/22 0329    sodium chloride      norepinephrine Stopped (11/07/22 1540)     PRN Meds: traMADol, sodium chloride flush, sodium chloride, ondansetron **OR** ondansetron, bisacodyl, senna, aluminum & magnesium hydroxide-simethicone, acetaminophen **OR** acetaminophen      Physical Examination      Vitals:  BP (!) 128/48   Pulse 70   Temp  98.2 F (36.8 C) (Axillary)   Resp 15   Ht 1.73 m (5' 8.11")   Wt 98.2 kg (216 lb 7.9 oz)   SpO2 94%   BMI 32.81 kg/m   Temp (24hrs), Avg:97.8 F (36.6 C), Min:97.5 F (36.4 C), Max:98.2 F (36.8 C)      I/O (24Hr):    Intake/Output Summary (Last 24 hours) at 11/08/2022 0843  Last data filed at 11/08/2022 0600  Gross per 24 hour   Intake 7458.55 ml   Output 1625 ml   Net 5833.55 ml         General: Somnolent, no obvious distress, elderly, ill-appearing female, NEFT in place  Neck: supple, no JVD  Lungs: Clear to auscultation, non-labored respirations, no wheezing  Heart: Normal rate, regular rhythm, no murmur, 3/6 SEM  Musculoskeletal: Warm, well perfused, no joint swelling or tenderness  Abdomen: Soft, non-tender, non-distended, normal bowel sounds  Neurologic: Right facial droop, right hemiplegia, expressive aphasia, follows commands with left side.   Skin:  sacral area with approx 13cm x10 cm black eschar  with underlying fluctuance  Extremities: no cyanosis, no edema     Labs/Imaging/Diagnostics   Labs:  CBC:  Recent Labs     11/06/22  2022 11/07/22  0347 11/08/22  0346   WBC 20.0* 14.9* 10.5   RBC 3.93 3.83 3.40*   HGB 11.5 11.1* 9.7*   HCT 36.2 35.3  31.8*   MCV 92.1 92.2 93.5   RDW 14.0 14.5 14.6   PLT 229 246 200       CHEMISTRIES:  Recent Labs     11/07/22  0347 11/07/22  1942 11/08/22  0346   NA 147* 149* 152*   K 3.8 3.3* 3.5   CL 110* 115* 119*   CO2 20* 22 23   BUN 169* 154* 150*   CREATININE 2.8* 2.4* 2.5*   GLUCOSE 167* 152* 145*   PHOS  --   --  4.2   MG 2.7* 2.8* 2.7*       LIVER PROFILE:  Recent Labs     11/06/22  2022 11/07/22  0347 11/08/22  0346   AST 73* 69* 42*   ALT 63* 58* 39*   BILITOT 0.78 0.67 0.35   ALKPHOS 80 79 63         Imaging Last 24 Hours:    XR CHEST PORTABLE    Result Date: 11/06/2022  CHEST:   AP (or PA),  11/06/2022 7:24 PM INDICATION: line placement  COMPARISON: Earlier same day FINDINGS: Right IJ central line is seen with its tip over superior vena cava. No  pneumothorax is  appreciated. Cardiac silhouette is probably not enlarged allowing for poor inspiration. Minimal curvilinear atelectasis or scarring in the left lung base again seen. No  pleural fluid.     Placement of right IJ central line with its tip in SVC and no pneumothorax appreciated.    XR CHEST PORTABLE    Result Date: 11/06/2022  Single View Chest: 11/06/22,17:20 History:  S20.429A,Blister (nonthermal) of unspecified back wall of thorax, initial encounter,ICD-10-CM  decreased sat/ possible fluid over;load Comparison: 11/06/2022, 11:43 Findings: Lung volumes are lower. Pulmonary vascularity and reticulonodular opacities are accentuated. Mild cardiomegaly is present with left ventricular prominence. Patient is rotated and tilted to the left.     Impression:Pulmonary venous hypertension.     MRA NECK W CONTRAST    Result Date: 11/06/2022  MRA Neck with gadolinium 11/06/22 COMPARISON: None INDICATION: cva, TECHNIQUE: Gadolinium enhanced 3-D time-of-flight MR angiography of the neck FINDINGS:  Use of NASCET Criteria RIGHT ICA: Right ICA origin shows moderate narrowing, likely not exceeding 30-40%. LEFT ICA: Left ICA origin suggest no significant stenosis, favor mild narrowing.  Small opacified structure at the proximal ICA, cannot exclude ulcerated plaque VERTEBRAL ARTERIES: Vertebral arteries are widely patent in their mid and distal  segments, origins are not well delineated AORTIC ARCH: Arch is limited, proximal brachial cephalic vessels are not well visualized, difficult to differentiate anatomic from artifact     1. MRA neck: Limited by excessive motion artifact Right ICA, mild to moderate narrowing, likely not exceeding 30-40% Left ICA, mild narrowing at the origin, small vascular structure near the bifurcation,                Cannot exclude 5 mm ulcerated plaque Vertebral arteries are bilaterally patent, proximal segments, origins, as well as the origins of the other brachiocephalic vessels are not well visualized, cannot  differentiate artifact from anatomic    MRI BRAIN WO CONTRAST    Result Date: 11/06/2022  MRI and MRA Brain without contrast: 11/06/22 CLINICAL INDICATION:  CVA. Found on floor, right-sided facial droop, right-sided  weakness with slurred speech TECHNIQUE: Sagittal and axial spin-echo T1-weighted, axial fast spin-echo T2-weighted, FLAIR, diffusion, and gradient echo imaging.Coronal diffusion. 3-D time-of-flight MR angiography of the brain COMPARISON: CT scan November 06, 2022. FINDINGS: MRI BRAIN: No mass or  shift of midline structure. Acute left para midline pontine infarct, 14 mm in diameter. Small acute infarct anterior inferior medial right cerebellum, 7 mm. Small acute/subacute lacunar infarct left frontal white matter. Severe underlying small vessel change. Generalized atrophy with prominent temporal lobe involvement.  The major intracranial vessels are patent.  The paranasal sinuses and mastoid air cells are clear. Orbital structures are normal. Craniocervical junction is normal. MRA BRAIN: Distal internal carotid arteries are bilaterally patent. Anterior and middle cerebral arteries are patent. Focal moderate stenosis left P2, severe stenosis left ACA anterior to the genu. The distal vertebral basilar arteries are patent.  Severe stenosis of both posterior cerebral arteries, on the right both the proximal and distal right P2, on the left mid P2 facet.     1. MRI brain 14 mm acute perforator infarct left pons 7 mm acute infarct right anterior inferior cerebellum 4 mm acute/subacute lacunar infarct left frontal, periventricular white matter No hemorrhage, severe underlying small vessel change Generalized atrophy with prominent temporal lobe involvement 2. MRA brain No large vessel occlusion. Left ACA, severe stenosis Left M2, moderate stenosis Right P2, severe stenosis proximal and distal segment Left P2, severe stenosis mid segment    MRA HEAD WO CONTRAST    Result Date: 11/06/2022  MRI and MRA Brain without  contrast: 11/06/22 CLINICAL INDICATION:  CVA. Found on floor, right-sided facial droop, right-sided  weakness with slurred speech TECHNIQUE: Sagittal and axial spin-echo T1-weighted, axial fast spin-echo T2-weighted, FLAIR, diffusion, and gradient echo imaging.Coronal diffusion. 3-D time-of-flight MR angiography of the brain COMPARISON: CT scan November 06, 2022. FINDINGS: MRI BRAIN: No mass or shift of midline structure. Acute left para midline pontine infarct, 14 mm in diameter. Small acute infarct anterior inferior medial right cerebellum, 7 mm. Small acute/subacute lacunar infarct left frontal white matter. Severe underlying small vessel change. Generalized atrophy with prominent temporal lobe involvement.  The major intracranial vessels are patent.  The paranasal sinuses and mastoid air cells are clear. Orbital structures are normal. Craniocervical junction is normal. MRA BRAIN: Distal internal carotid arteries are bilaterally patent. Anterior and middle cerebral arteries are patent. Focal moderate stenosis left P2, severe stenosis left ACA anterior to the genu. The distal vertebral basilar arteries are patent.  Severe stenosis of both posterior cerebral arteries, on the right both the proximal and distal right P2, on the left mid P2 facet.     1. MRI brain 14 mm acute perforator infarct left pons 7 mm acute infarct right anterior inferior cerebellum 4 mm acute/subacute lacunar infarct left frontal, periventricular white matter No hemorrhage, severe underlying small vessel change Generalized atrophy with prominent temporal lobe involvement 2. MRA brain No large vessel occlusion. Left ACA, severe stenosis Left M2, moderate stenosis Right P2, severe stenosis proximal and distal segment Left P2, severe stenosis mid segment    CT ABDOMEN PELVIS WO CONTRAST Additional Contrast? None    Result Date: 11/06/2022  CT abdomen pelvis without contrast: 11/06/22 INDICATION: low back wound N17.9,Acute kidney failure,  unspecified,ICD-10-CM COMPARISON:  None TECHNIQUE: Routine noncontrast protocol (Axial imaging from the lung bases to the pubic symphysis with coronal reconstruction.) CT scanning was performed using radiation dose reduction techniques when appropriate, per system protocols. FINDINGS: Evaluation of the viscera and vasculature is diminished without the use of IV contrast.. Lower thorax: No pleural or pericardial effusion.  Partially imaged coronary artery calcifications. Mitral annular calcification. Bibasilar atelectasis. Small hiatal hernia. Liver and Biliary:  Liver normal size and contour.   No biliary dilatation.  No gallbladder wall thickening or pericholecystic fluid. No radiopaque gallstones. Pancreas:  Pancreas appears atrophic with some scattered mild calcifications likely sequela of prior pancreatitis without CT evidence of acute pancreatitis at this time. Spleen:  Normal. Adrenal Glands:  Normal.  Urinary system:  No hydronephrosis. 9 mm nonobstructing right kidney stone. Otherwise unremarkable noncontrast appearance the right kidney. Mild diffuse atrophy and scattered cortical scarring left kidney.  2 mm stone in the urinary bladder. Urinary bladder decompressed by Foley catheter.  Pelvic reproductive organs:  Unremarkable.   Peritoneal cavity:  No free air or fluid.  Vascular:  No aortic aneurysm.  Moderate/severe atherosclerosis. Lymph nodes:  None enlarged.  Gastrointestinal:  No bowel obstruction.   Appendix not seen, but there is no pericecal inflammatory change to suggest appendicitis.   Colonic diverticulosis without diverticulitis .  Partially imaged tubing terminates in the lower rectum. Bones: No definite acute bony findings. Bones appear osteopenic. There are multilevel compression deformities of the lumbar spine and at T12, age-indeterminate but favored to be chronic. Multilevel degenerative changes of the spine. Additional findings, if any: Pelvic floor relaxation..      1. No definite acute  findings by noncontrast technique.  2. 9 mm nonobstructing right kidney stone. 2 mm stone urinary bladder. Urinary bladder decompressed by Foley catheter. 3. Pelvic floor relaxation. 4. Bones appear osteopenic. Multilevel compression deformities lumbar spine and at T12 are technically age-indeterminate but favored to be chronic. Correlate for point tenderness.    CT Head W/O Contrast    Result Date: 11/06/2022  CT head without: 11/06/22 INDICATION: AMS N17.9,Acute kidney failure, unspecified,ICD-10-CM COMPARISON: None TECHNIQUE: Noncontrast axial imaging from foramen magnum to the vertex. CT scanning was performed using radiation dose reduction techniques when appropriate, per system protocols. FINDINGS: No intra-axial or extra axial hemorrhage. No extra-axial fluid collection. No intra-axial mass effect or midline shift. Bilateral cerebellar lacunar infarcts. Elsewhere Preservation of gray-white differentiation. Mild atrophy with increased sulcation and ex-vacuo dilatation of the ventricles. No hydrocephalus. Moderate/severe white matter hypodensities, predominantly periventricular, nonspecific but are likely chronic microangiopathic ischemic changes. Suprasellar and quadrigeminal plate cisterns are patent, without herniation. No soft tissue abnormality, fracture or osseous lesion. Visualized portions of the orbits are normal. Included paranasal sinuses and mastoid air cells are predominantly clear.     No acute intracranial abnormality.   Chronic findings as above.    XR CHEST PORTABLE    Result Date: 11/06/2022  Chest AP: 11/06/22 INDICATION: Altered Mental Status. COMPARISON: None available for review. FINDINGS: Lungs/pleura: Consolidative opacities in the right midlung and left lung base. No pleural effusion or pneumothorax. Cardiomediastinum: Unremarkable. Bones/ Soft tissues: No acute osseous abnormality.     Consolidative opacities in the right midlung and left lung base. Findings could reflect pneumonia in the  appropriate clinical context, however underlying nodule  not excluded. At a minimum, recommend short-term follow-up two-view chest radiograph in 4-6 weeks for further evaluation.         Assessment        Hospital Problems             Last Modified POA    * ([facility]) Altered mental status 11/06/2022 Yes    AKI (acute kidney injury) ([place]) 11/07/2022 Yes    Dehydration 11/07/2022 Yes    Palliative care by specialist 11/07/2022 Yes     Plan:      Altered mental status, found down  Metabolic encephalopathy  -May have been down as long as 6 days  - Unclear if fall  was caused by a stroke or if patient may subsequently had a stroke while lying on the ground  -Mental status seems to be improving with fluids but does still appear to have right facial droop and right sided weakness with garbled speech     Right facial droop, right-sided weakness, Dysphagia, CVA  - Known left carotid stenosis  - MRI, MRA head and neck showed no LVO, several areas of stroke  - Continue aspirin  - Echo 3/8: EF 65-70%  - Neuro consult  - Fasting lipid panel  - Speech therapy, PT, OT, PPD    Hypernatremia  - Na 152, FWF increased to 75cc q1h     Aspiration pneumonia with severe sepsis +/- hypovolemic shock  Hypothermia  Lactic acidosis  Leukocytosis  -Blood cultures sent  - Patient has been given a 30 mg/kg bolus  - Zosyn  - Needs follow-up imaging as radiology reported they could not exclude underlying nodule     AKI  - IV fluids  - Creat stable at 2.5, BUN 150  -Consider additional workup with renal function does not resolve with fluids    Rhabdomyolysis  - patient found down at home, likely down for approx 6 days according to family  - CPK peaked at 800 and is down trending, and lactic acid now less than 2     Sacral wound with eschar, present on admission  - approximately 13 x10 cm eschar in sacral area with fluctuance  - Due to prolonged downtime  - Wound care consult  - General surgery consulted and plan for debridement in OR on 3/11     Acute  hypoxemic resp failure- improved  -due to shock and presumed pna  -O2 as needed to keep  O2 sats greater than or equal to 92%     Elevated troponin, aortic valve stenosis  - Likely stress-induced ischemia  - Echo: Mod Aortic Valve stenosis. AVA 1.6 cm2, peak AV grad 23, severe mitral annular calcification     Chronic compression fracture  - Patient with chronic back pain and some debility  - PT, OT       Dispo: ICU  Code status: DNR  Medical Decision maker: Extended Emergency Contact Information  Primary Emergency Contact: Mandarino,David, son.   Mobile Phone: [phone]  Secondary Emergency Contact: [place]  Mobile Phone: [phone]  PCP: Leafy Ro, MD     Electronically signed by Senaida Ores, PA on 11/07/22 at 7:56 AM EST     MD addendum:    I assessed and examined the patient, and I agree with the note above.    I spoke with the family at the bedside.    Awake but sluggish, no distress  Oropharynx clear  Regular rate and rhythm  Clear to auscultation  Abdomen soft, nontender, nondistended, with normal bowel sounds  Extremities without edema    Plans:    1.  Continue to monitor renal function.  Urine output is adequate, and BUN is improving.    2.  Increase free water intake for hypernatremia.    3.  Continue empiric antibiotic for positive blood culture.    4.  Initiate PT, OT, SLP when appropriate.    5.  Plans for surgical intervention for decubitus ulcer 11/10/2022.    Graceann Congress Rogelio Seen, MD, made rounds with the multidisciplinary team, examined the patient, and developed the assessment and plan.     To this point today, I have spent 35 minutes providing critical care management  for this patient.

## 2022-11-08 NOTE — Progress Notes (Signed)
Pharmacy Progress Note    SUBJECTIVE     REASON FOR CONSULT: Dosing and monitoring of vancomycin.  INDICATION: GPC bacteremia of suspected SSTI source   Treatment day: 2  ------------------------------------------------------------------------------------------------------------------  OBJECTIVE   Admission weight: 88 kg (194 lb 0.1 oz)   Ideal body weight: 64.2 kg (141 lb 6.9 oz)  Adjusted ideal body weight: 77.8 kg (171 lb 7.3 oz)     Past Medical History:   Diagnosis Date    Hx of spinal cord injury     Leaking of urine        Pertinent Cultures:   3/7 blood aerobic and anaerobic, peripheral: GPC  3/7 blood anaerobic, peripheral: GPC  -------------------------------------------------------------------------------------------------------------------  ASSESSMENT / PLAN     Ashley Davies  is a 87 y.o. female receiving vancomycin for the treatment of GPC bacteremia of suspected SSTI source.    Current drug regimen: Vancomycin dosed per random levels. Last dose 1500 mg on 3/8  Recent levels: random level drawn 3/9 was 15.2 mcg/mL.  Renal function:  Current Scr 2.5 mg/dL decreased from 2.8 mg/dL (baseline SCr 0.7 mg/dL); Estimated Creatinine Clearance: 20 mL/min (A) (based on SCr of 2.5 mg/dL (H)).. 24 hr UOP greater than 0.5 mL/kg/hr.  Goal levels: Random level 15-20 mcg/mL  New drug regimen: vancomycin 1250 mg x 1 dose today  F/U monitoring and levels: A Random Level is ordered for 3/10. A clinical pharmacist will continue to monitor drug levels and renal function, and make dose adjustments as appropriate.  Expected duration of therapy: to be determined by clinical course.    Thank you for allowing me to participate in the care of this patient. Please contact your Clinical Pharmacist with any questions or concerns.    Daneil Dan, PharmD

## 2022-11-09 LAB — CBC WITH AUTO DIFFERENTIAL
Absolute Baso #: 0 10*3/uL (ref 0.0–0.2)
Absolute Eos #: 0.2 10*3/uL (ref 0.0–0.5)
Absolute Lymph #: 0.9 10*3/uL — ABNORMAL LOW (ref 1.0–3.2)
Absolute Mono #: 1.4 10*3/uL — ABNORMAL HIGH (ref 0.3–1.0)
Basophils %: 0.4 % (ref 0.0–2.0)
Eosinophils %: 1.6 % (ref 0.0–7.0)
Hematocrit: 33 % — ABNORMAL LOW (ref 34.0–47.0)
Hemoglobin: 9.9 g/dL — ABNORMAL LOW (ref 11.5–15.7)
Immature Grans (Abs): 0.28 10*3/uL — ABNORMAL HIGH (ref 0.00–0.06)
Immature Granulocytes: 2.7 % — ABNORMAL HIGH (ref 0.0–0.6)
Lymphocytes: 8.7 % — ABNORMAL LOW (ref 15.0–45.0)
MCH: 28.6 pg (ref 27.0–34.5)
MCHC: 30 g/dL — ABNORMAL LOW (ref 32.0–36.0)
MCV: 95.4 fL (ref 81.0–99.0)
MPV: 11.2 fL (ref 7.2–13.2)
Monocytes: 13.1 % — ABNORMAL HIGH (ref 4.0–12.0)
NRBC Absolute: 0.02 10*3/uL — ABNORMAL HIGH (ref 0.000–0.012)
NRBC Automated: 0.2 % (ref 0.0–0.2)
Neutrophils %: 73.5 % (ref 42.0–74.0)
Neutrophils Absolute: 7.7 10*3/uL — ABNORMAL HIGH (ref 1.6–7.3)
Platelets: 210 10*3/uL (ref 140–440)
RBC: 3.46 x10e6/mcL — ABNORMAL LOW (ref 3.60–5.20)
RDW: 14.9 % (ref 11.0–16.0)
WBC: 10.5 10*3/uL (ref 3.8–10.6)

## 2022-11-09 LAB — COMPREHENSIVE METABOLIC PANEL
ALT: 34 U/L (ref 0–35)
AST: 32 U/L (ref 0–35)
Albumin/Globulin Ratio: 0.8 — ABNORMAL LOW (ref 1.00–2.70)
Albumin: 2.2 g/dL — ABNORMAL LOW (ref 3.5–5.2)
Alk Phosphatase: 65 U/L (ref 35–117)
Anion Gap: 12 mmol/L (ref 2–17)
BUN: 118 mg/dL — ABNORMAL HIGH (ref 8–23)
CALCIUM,CORRECTED,CCA: 9 mg/dL (ref 8.8–10.2)
CO2: 22 mmol/L (ref 22–29)
Calcium: 8 mg/dL — ABNORMAL LOW (ref 8.8–10.2)
Chloride: 118 mmol/L — ABNORMAL HIGH (ref 98–107)
Creatinine: 2 mg/dL — ABNORMAL HIGH (ref 0.5–1.0)
Est, Glom Filt Rate: 24 mL/min/1.73m — ABNORMAL LOW (ref >=60)
Globulin: 2.6 g/dL (ref 1.9–4.4)
Glucose: 149 mg/dL — ABNORMAL HIGH (ref 70–99)
OSMOLALITY CALCULATED: 341 mOsm/kg — ABNORMAL HIGH (ref 270–287)
Potassium: 3.7 mmol/L (ref 3.5–5.3)
Sodium: 152 mmol/L — ABNORMAL HIGH (ref 135–145)
Total Bilirubin: 0.36 mg/dL (ref 0.00–1.20)
Total Protein: 4.8 g/dL — ABNORMAL LOW (ref 6.4–8.3)

## 2022-11-09 LAB — BASIC METABOLIC PANEL W/ REFLEX TO MG FOR LOW K
Anion Gap: 10 mmol/L (ref 2–17)
Anion Gap: 11 mmol/L (ref 2–17)
Anion Gap: 12 mmol/L (ref 2–17)
BUN: 118 mg/dL — ABNORMAL HIGH (ref 8–23)
BUN: 126 mg/dL — ABNORMAL HIGH (ref 8–23)
BUN: 92 mg/dL — ABNORMAL HIGH (ref 8–23)
CO2: 22 mmol/L (ref 22–29)
CO2: 23 mmol/L (ref 22–29)
CO2: 23 mmol/L (ref 22–29)
Calcium: 8 mg/dL — ABNORMAL LOW (ref 8.8–10.2)
Calcium: 8.1 mg/dL — ABNORMAL LOW (ref 8.8–10.2)
Calcium: 8.1 mg/dL — ABNORMAL LOW (ref 8.8–10.2)
Chloride: 115 mmol/L — ABNORMAL HIGH (ref 98–107)
Chloride: 118 mmol/L — ABNORMAL HIGH (ref 98–107)
Chloride: 118 mmol/L — ABNORMAL HIGH (ref 98–107)
Creatinine: 1.7 mg/dL — ABNORMAL HIGH (ref 0.5–1.0)
Creatinine: 2 mg/dL — ABNORMAL HIGH (ref 0.5–1.0)
Creatinine: 2.1 mg/dL — ABNORMAL HIGH (ref 0.5–1.0)
Est, Glom Filt Rate: 23 mL/min/1.73m — ABNORMAL LOW (ref >=60)
Est, Glom Filt Rate: 24 mL/min/1.73m — ABNORMAL LOW (ref >=60)
Est, Glom Filt Rate: 29 mL/min/1.73m — ABNORMAL LOW (ref >=60)
Glucose: 128 mg/dL — ABNORMAL HIGH (ref 70–99)
Glucose: 144 mg/dL — ABNORMAL HIGH (ref 70–99)
Glucose: 149 mg/dL — ABNORMAL HIGH (ref 70–99)
OSMOLALITY CALCULATED: 325 mOsm/kg — ABNORMAL HIGH (ref 270–287)
OSMOLALITY CALCULATED: 340 mOsm/kg — ABNORMAL HIGH (ref 270–287)
OSMOLALITY CALCULATED: 341 mOsm/kg — ABNORMAL HIGH (ref 270–287)
Potassium: 3.6 mmol/L (ref 3.5–5.3)
Potassium: 3.6 mmol/L (ref 3.5–5.3)
Potassium: 3.7 mmol/L (ref 3.5–5.3)
Sodium: 149 mmol/L — ABNORMAL HIGH (ref 135–145)
Sodium: 151 mmol/L — ABNORMAL HIGH (ref 135–145)
Sodium: 152 mmol/L — ABNORMAL HIGH (ref 135–145)

## 2022-11-09 LAB — CULTURE, BLOOD 1: FINAL REPORT: NEGATIVE — AB

## 2022-11-09 LAB — PLEASE READ & DOCUMENT PPD TEST IN 48 HRS: mm Induration: 0 mm (ref 0–0)

## 2022-11-09 MED ORDER — PIPERACILLIN SOD-TAZOBACTAM SO 3.375 (3-0.375) G IV SOLR
3.3753-0.375 (3-0.375) g | Freq: Three times a day (TID) | INTRAVENOUS | Status: DC
Start: 2022-11-09 — End: 2022-11-12
  Administered 2022-11-09 – 2022-11-12 (×9): 3375 mg via INTRAVENOUS

## 2022-11-09 MED FILL — TYLENOL 325 MG PO TABS: 325 MG | ORAL | Qty: 2

## 2022-11-09 MED FILL — TRAMADOL HCL 50 MG PO TABS: 50 MG | ORAL | Qty: 1

## 2022-11-09 MED FILL — HEPARIN SODIUM (PORCINE) 5000 UNIT/ML IJ SOLN: 5000 UNIT/ML | INTRAMUSCULAR | Qty: 1

## 2022-11-09 MED FILL — PIPERACILLIN SOD-TAZOBACTAM SO 3.375 (3-0.375) G IV SOLR: 3.375 (3-0.375) g | INTRAVENOUS | Qty: 3375

## 2022-11-09 MED FILL — ASPIRIN 81 MG PO CHEW: 81 MG | ORAL | Qty: 1

## 2022-11-09 MED FILL — NORMAL SALINE FLUSH 0.9 % IV SOLN: 0.9 % | INTRAVENOUS | Qty: 40

## 2022-11-09 MED FILL — NORMAL SALINE FLUSH 0.9 % IV SOLN: 0.9 % | INTRAVENOUS | Qty: 20

## 2022-11-09 NOTE — Progress Notes (Signed)
11/09/22 1018   Encounter Summary   Encounter Overview/Reason  Follow-up   Service Provided For: Family   Referral/Consult From: Other chaplain   Support System Children;Family members   Crisis   Type Emotional distress   Assessment/Intervention/Outcome   Assessment Anxious;Fearful;Decisional conflict   Intervention Active listening;Sustaining Presence/Ministry of presence   Outcome Comfort;Engaged in conversation;Receptive     While rounding, this chaplain briefly visited the patient's room.  The granddaughter was in the room. I introduced myself from pastoral care services. The granddaughter asked to speak in private so this chaplain met with the granddaughter in the consult room outside of [facility]. This chaplain provided the granddaughter with a calm and caring presence, listened reflectively and with empathy, asking open-ended questions and holding space for feelings and offering corresponding emotional validation and support. The granddaughter seems to be struggling with the patient's condition. This chaplain elevated her concerns to the charge nurse. Pastoral Care services remains available to follow up as needed or consulted.

## 2022-11-09 NOTE — Consults (Signed)
Pt is a 87yo F with Hx AS  Admit on 3/7 with found down x3d in feces. Had facial droop and R hemiparesis, MRI brain found CVA. Imaging suggested aspiration PNA and pt started on broad spectrum Abx. Has sacral decub, for debridement tomorrow. Needed pressor, now off.   On exam pt is sleeping and does nto wake up.     Allergies:  Patient has no known allergies.     Medications home:  Prior to Visit Medications    Medication Sig Taking? Authorizing Provider   Mirabegron (MYRBETRIQ PO) Take by mouth  [provider]   psyllium 0.52 g capsule Take 2 capsules by mouth in the morning and at bedtime  Inda Merlin, Pincus Large, APRN - NP        Physical Exam:  BP (!) 149/59   Pulse 72   Temp 98.1 F (36.7 C) (Axillary)   Resp 12   Ht 1.73 m (5' 8.11")   Wt 96.4 kg (212 lb 8.4 oz)   SpO2 97%   BMI 32.21 kg/m    Ideal body weight: Ideal body weight: 64.2 kg (141 lb 6.9 oz)  Adjusted ideal body weight: 77.1 kg (169 lb 13.9 oz)    General: AMS  Neck: Supple, non-tender, no carotid bruits, no JVD, no lymphadenopathy.  Lungs: Clear to auscultation and percussion, non-labored respiration.  Heart: Normal rate, regular rhythm, no murmur, gallop or edema.  Abdomen: Soft, non-tender, non-distended, normal bowel sounds, no masses.  Musculoskeletal: Normal range of motion and strength, no tenderness or swelling.  Skin: Skin is warm, dry, no rashes or lesions.  Dobhoff, sacral decub, R IJ, foley    ROS:  Unable to complete as pt does not wake up    Labs:  Cr 2.0  WBC 11  MRSA nares neg  UA neg, UCx neg  CT abdo noncontrast neg  TTE AS, no veg    Assessment:  3/7 Bcx x1 proteus mirabilis, other Bcx x1 globicatella, aerococcus, paenalicalignes.     Plan:  Source sacral decub  Recc empiric zosyn. Do not use cephalosporins or carbapenems.

## 2022-11-09 NOTE — Progress Notes (Signed)
[facility] Hospitalist Service     Hospitalist Progress Note     PCP: Leafy Ro, MD   Admission Date: 11/06/2022 11:19 AM    Admitted from: ICU     Subjective   Notified by ICU team that patient is stable for transfer to floor. Unfortunate care of 87 yo female who was found down at home, she typically lives alone. Suspect she was down for multiple days. Found to have right sided hemiparesis and right facial droop, due to CVA. Also septic with polymicrobial bacteremia, infected sacral wound.     I told daughter at bedside I suspect she will need rehab and likely be nursing home dependent for the near future if not indefinitely.    Surgery to perform debridement tomorrow. Family asks if they would do a local nerve block instead of general anesthesia given her co morbidities, I discussed this would be between surgery and anesthesia. Daughter is also concerned that her mother may be in pain as she is not verbal and unable to tell the team, but I reassured her she appeared comfortable at this time    Consult ID for antibiotic guidance       Physical Exam   Vitals: BP (!) 149/59   Pulse 72   Temp 98.1 F (36.7 C) (Axillary)   Resp 12   Ht 1.73 m (5' 8.11")   Wt 96.4 kg (212 lb 8.4 oz)   SpO2 97%   BMI 32.21 kg/m Temp (24hrs), Avg:97.6 F (36.4 C), Min:97 F (36.1 C), Max:98.1 F (36.7 C)      Constitutional: see vital signs above. Acutely ill appearing female. Conservation officer, nature but non-verbal. Follows commands  Eyes: conjunctivae and lids - normal. Pupils and irises - PEERL, normal.  Respiratory: respiratory effort - normal. Lungs CTA.  Cardiovascular: regular rate, regular rhythm, no murmurs. No clubbing or cyanosis. No edema. No varicosities.  Gastrointestinal: Normal bowel sounds. No tenderness. No hepatomegaly or splenomegaly. NGT in place  Skin: No rashes or other lesions. No induration or subcutaneous masses  Neuro: Alert, dense right hemiplegia, expressive aphasia, right facial droop       Labs and  Imaging   Data: I have personally reviewed all lab results and independently reviewed imaging studies performed in the past 24 hours.    Hematologic/Coags Chemistries   Recent Labs     11/06/22  2022 11/07/22  0347 11/08/22  0346 11/09/22  0509   WBC 20.0* 14.9* 10.5 10.5   HGB 11.5 11.1* 9.7* 9.9*   HCT 36.2 35.3 31.8* 33.0*   PLT 229 246 200 210     Lab Results   Component Value Date/Time    PROT 4.8 11/09/2022 05:09 AM     No results found for: "INR", "PROTIME"  No results found for: "APTT"   Recent Labs     11/07/22  0347 11/07/22  1942 11/08/22  0346 11/08/22  1508 11/08/22  2115 11/09/22  0509   NA 147*   < > 152* 152* 151* 152*  152*   K 3.8   < > 3.5 3.6 3.6 3.7  3.7   CL 110*   < > 119* 118* 118* 118*  118*   CO2 20*   < > '23 23 23 22  22   '$ BUN 169*   < > 150* 134* 126* 118*  118*   CREATININE 2.8*   < > 2.5* 2.3* 2.1* 2.0*  2.0*   BILITOT 0.67  --  0.35  --   --  0.36   ALKPHOS 79  --  63  --   --  65   AST 69*  --  42*  --   --  32   ALT 58*  --  39*  --   --  34    < > = values in this interval not displayed.     No results for input(s): "GLU" in the last 72 hours.  No results found for: "CPK", "CKMB", "TROPONINI"  No results found for: "IRON", "FERRITIN"     Inflammatory/Respiratory Diabetes   No results found for: "CRP"  No results found for: "ESR"  ABGs:  Lab Results   Component Value Date/Time    PHART 7.337 11/06/2022 05:34 PM    PO2ART 322.0 11/06/2022 05:34 PM    PCO2ART 38.4 11/06/2022 05:34 PM      Lab Results   Component Value Date/Time    CREATININE 2.0 11/09/2022 05:09 AM    CREATININE 2.0 11/09/2022 05:09 AM                Scheduled Meds:    piperacillin-tazobactam  3,375 mg IntraVENous Q8H    aspirin  81 mg Per NG tube Daily    sodium chloride flush  5-40 mL IntraVENous 2 times per day    heparin (porcine)  5,000 Units SubCUTAneous 3 times per day     Continuous Infusions:    sodium chloride         Assessment and Plan:  Principal Problem:    Altered mental status  Active Problems:     AKI (acute kidney injury) ([place])    Dehydration    Palliative care by specialist    Acute embolic stroke ([employer])    Hemiplegia and hemiparesis following cerebral infarction affecting right dominant side ([place])  Resolved Problems:    * No resolved hospital problems. *      Plan by Problem  CVA with Right facial droop, right hemiplegia, Dysphagia   - Known left carotid stenosis  - MRI, MRA head and neck showed no LVO. imaging shows several areas of stroke  - Continue aspirin  - Echo 3/8: EF 65-70%  - Neuro following  - Speech therapy, PT, OT, PPD     Metabolic encephalopathy  -May have been down as long as 6 days  - Unclear if fall was caused by a stroke or if patient may subsequently had a stroke while lying on the ground     Hypernatremia  - Na 152, FWF increased again, now up to 150 every hour     Aspiration pneumonia with severe sepsis +/- hypovolemic shock  -Continue broad-spectrum antibiotics Zosyn and vancomycin.    -Blood cultures remain negative  - No longer requiring pressor therapy  - Needs follow-up chest imaging as radiology reported they could not exclude underlying nodule     AKI  - Admitted with acute kidney injury.    -Continue IV fluids  - Creat is downtrending     Sacral wound with eschar, present on admission  SEPSIS present on admission  Polymicrobial bacteremia (Globicatella sanguinis, Aerococcus viridans, proteus mirabilis)  - approximately 13 x10 cm eschar in sacral area with fluctuance  - Due to prolonged downtime  - Wound care following  - General surgery consulted and plan for debridement in OR on 3/11  - Continue Vanc/Zosyn     Elevated troponin, aortic valve stenosis  - Likely stress-induced ischemia  - Echo: Mod Aortic Valve stenosis. AVA 1.6 cm2, peak AV grad  23, severe mitral annular calcification  -Family asks about anesthesia risk. I stated her risks of GA would be higher given her comorbidities but acceptable if surgery necessary    Rhabdomyolysis  - patient found down at home, likely  down for approx 6 days according to family  - CPK peaked at 83 and is down trending  - lactic acidosis resolved     Chronic compression fracture  - Patient with chronic back pain and some debility  - PT, OT     Multiple medical comorbidities  -DO NOT RESUSCITATE CODE STATUS  -Followed by palliative care       Prophylaxis/Bundle    Dispo: appropriate for floor transfer  Medical Decision maker: Extended Emergency Contact Information  Primary Emergency Contact: son Rudean Curt.   Mobile Phone: [phone]  Secondary Emergency Contact: [place] Phone: [phone]  PCP: Leafy Ro, MD      Disposition    EDD:  Stable for transfer out of the ICU, but remains ill, requiring IV antibiotics, surgical procedures    Bobbye Charleston, MD  11/09/2022 2:33 PM  [facility] Hospitalist Service

## 2022-11-09 NOTE — Plan of Care (Signed)
Received report. Pt lying in bed with family at bedside. Pt intermittently awake and falls asleep.  Will wake up to voice.  NAD. Assessment complete. Will continue to monitor.

## 2022-11-09 NOTE — Progress Notes (Signed)
Nutrition Brief Note: TF monitoring      Pt tolerating TF at start rate, hypernatremia worsening again today, up to 152. PA increased FWF to 134m/h yesterday afternoon. Increasing FWF again today to 1560mh. Free water deficit is 3.7L at this time, 15023m should exceed needs and address hypernatremia.     All pressors remain off. Plan for surgical debridement tomorrow. DNR     Nutrition Recommendations/Plan:   NPO  Enteral Nutrition, adjust FWF: Continue Vital AF 1.2 via NEFT at 70m69m with 150ml39mFWF for hypernatremia - 3.6L free water daily from flushes   -If pt tolerates and electrolytes WNL, advance 10ml 106m to goal rate of Vital AF 1.2 at 55ml/h68mth 20ml/hr91m+Juven BID.  -This formula is to provide 1764kcal (99%EEN), 103g pro (97%EPN), 147g carb, and 1069ml of 29mr.  -Adjust fluid as needed. Total fluid provision: 1069+480=1549ml of w22m daily.  *FWD: 3.8L (Currently being addressed with LR at 100ml/hr an68mcrease FWF).  3. Rec MVI, Thiamine in the setting of malnutrition. Pt at risk for refeeding, replace electrolytes as needed.    Lab Results   Component Value Date/Time    NA 152 11/09/2022 05:09 AM    NA 152 11/09/2022 05:09 AM    K 3.7 11/09/2022 05:09 AM    K 3.7 11/09/2022 05:09 AM    CL 118 11/09/2022 05:09 AM    CL 118 11/09/2022 05:09 AM    CO2 22 11/09/2022 05:09 AM    CO2 22 11/09/2022 05:09 AM    BUN 118 11/09/2022 05:09 AM    BUN 118 11/09/2022 05:09 AM    CREATININE 2.0 11/09/2022 05:09 AM    CREATININE 2.0 11/09/2022 05:09 AM    GLUCOSE 149 11/09/2022 05:09 AM    GLUCOSE 149 11/09/2022 05:09 AM    CALCIUM 8.0 11/09/2022 05:09 AM    CALCIUM 8.0 11/09/2022 05:09 AM    PHOS 4.2 11/08/2022 03:46 AM    MG 2.7 11/08/2022 03:46 AM     Lab Results   Component Value Date/Time    TRIG 129 02/03/2022 12:12 PM     Lab Results   Component Value Date/Time    POCGLU 137.0 11/08/2022 12:21 PM    POCGLU 123.0 11/06/2022 05:18 PM    POCGLU 129.0 11/06/2022 04:44 PM       aspirin  81 mg Per NG tube  Daily    sodium chloride flush  5-40 mL IntraVENous 2 times per day    heparin (porcine)  5,000 Units SubCUTAneous 3 times per day    piperacillin-tazobactam  3,375 mg IntraVENous Q12H     Past Medical History:   Diagnosis Date    Hx of spinal cord injury     Leaking of urine       Estimated Daily Nutrient Needs:  Energy Requirements Based On: Formula  Weight Used for Energy Requirements: Current  Energy (kcal/day): 1789kcal (MSJX1.3)  Weight Used for Protein Requirements: Current  Protein (g/day): 106-132g (1.2-1.5g/kg)  CHO: 224g (50%EEN)  Method Used for Fluid Requirements: 1 ml/kcal  Fluid (ml/day): 1789ml (1ml/k54m or69mr MD.     Will continue to follow per dept policies. Please c/s prn if needed      Aamirah Salmi O'GEsther Hardy   [phone] for allowing me to participate in the care of this patient.   Please contact your Registered Dietitian with any questions or concerns.      [facility]: [phone] [place]  [facility]: [phone]  Or message your clinical nutrition team via [facility]

## 2022-11-09 NOTE — Progress Notes (Signed)
ICU Progress Note    Date:11/09/2022       Room:5003/01  Patient Name:Ashley Davies     Date of Birth:March 17, 1936     Age:87 y.o.    Subjective     Interval History Status: improved.     Daily Summary:  3/7: Admitted to hospitalist service after being found down at home. Right hemiplegia and right facial droop. 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 AKI and sacral decubitus ulcer. CK 700s, IVF, tx to ICU due to hypotension, hypoxemia. CVC placed. IVC flat, IVF continued, Zosyn  3/8: Creat increased to 2.8, 495 cc UOP overnight, levophed at 5 mics, 1L 0.45NS, palliative care consultation, General Surgery consultation for sacral decubitus wound with eschar and fluctuance. Palliative care discussion with family. Code status changed to DNR. NEFT placed after failed swallow study  3/9: Hypernatremic this a.m. Na 152, FWF increased. BUN 150, creat 2.5. Patient without complaints. Right facial droop with Right hemiplegia. Levophed off 3/8  3/10: Pressors remain off.  Plan for surgical debridement tomorrow.  Sodium continues to climb despite increased free water flushes.  Arousable, follows exam commands with left side.  Right facial droop, right-sided hemiplegia persist. Intermittent cough,  thick sputum    Review of Systems     No fevers or chills.  Tolerating tube feeds.  Tolerating IV fluids    Medications   Scheduled Meds:    aspirin  81 mg Per NG tube Daily    sodium chloride flush  5-40 mL IntraVENous 2 times per day    heparin (porcine)  5,000 Units SubCUTAneous 3 times per day    piperacillin-tazobactam  3,375 mg IntraVENous Q12H     Continuous Infusions:    sodium chloride       PRN Meds: traMADol, sodium chloride flush, sodium chloride,  ondansetron **OR** ondansetron, bisacodyl, senna, aluminum & magnesium hydroxide-simethicone, acetaminophen **OR** acetaminophen      Physical Examination      Vitals:  BP (!) 111/58   Pulse 72   Temp 98.1 F (36.7 C) (Axillary)   Resp 15   Ht 1.73 m (5' 8.11")   Wt 96.4 kg (212 lb 8.4 oz)   SpO2 94%   BMI 32.21 kg/m   Temp (24hrs), Avg:97.6 F (36.4 C), Min:97 F (36.1 C), Max:98.1 F (36.7 C)      I/O (24Hr):    Intake/Output Summary (Last 24 hours) at 11/09/2022 0827  Last data filed at 11/09/2022 0600  Gross per 24 hour   Intake 4110.05 ml   Output 1375 ml   Net 2735.05 ml         General: Arousable to verbal stimuli.  Follows exam commands.  ENT: Nasal feeding tube  Neck: Trachea midline.  Right neck central access  Lungs: course B breath sounds.  Heart: Regular rhythm and rate.  Murmur  Musculoskeletal: Warm, well perfused.   Abdomen: Soft, non-tender, non-distended, normal bowel sounds  Neurologic: Right facial droop, right hemiplegia, expressive aphasia, follows commands with left side.   Skin:  sacral area with approx 13cm x10 cm black eschar  Extremities: no cyanosis, no edema     Labs/Imaging/Diagnostics   Labs:  CBC:  Recent Labs     11/07/22  0347 11/08/22  0346 11/09/22  0509   WBC  14.9* 10.5 10.5   RBC 3.83 3.40* 3.46*   HGB 11.1* 9.7* 9.9*   HCT 35.3 31.8* 33.0*   MCV 92.2 93.5 95.4   RDW 14.5 14.6 14.9   PLT 246 200 210       CHEMISTRIES:  Recent Labs     11/07/22  0347 11/07/22  1942 11/08/22  0346 11/08/22  1508 11/08/22  2115 11/09/22  0509   NA 147* 149* 152* 152* 151* 152*  152*   K 3.8 3.3* 3.5 3.6 3.6 3.7  3.7   CL 110* 115* 119* 118* 118* 118*  118*   CO2 20* '22 23 23 23 22  22   '$ BUN 169* 154* 150* 134* 126* 118*  118*   CREATININE 2.8* 2.4* 2.5* 2.3* 2.1* 2.0*  2.0*   GLUCOSE 167* 152* 145* 131* 144* 149*  149*   PHOS  --   --  4.2  --   --   --    MG 2.7* 2.8* 2.7*  --   --   --        LIVER PROFILE:  Recent Labs     11/07/22  0347 11/08/22  0346 11/09/22  0509   AST 69*  42* 32   ALT 58* 39* 34   BILITOT 0.67 0.35 0.36   ALKPHOS 79 63 65         Imaging Last 24 Hours:    XR CHEST PORTABLE    Result Date: 11/06/2022  CHEST:   AP (or PA),  11/06/2022 7:24 PM INDICATION: line placement  COMPARISON: Earlier same day FINDINGS: Right IJ central line is seen with its tip over superior vena cava. No  pneumothorax is appreciated. Cardiac silhouette is probably not enlarged allowing for poor inspiration. Minimal curvilinear atelectasis or scarring in the left lung base again seen. No  pleural fluid.     Placement of right IJ central line with its tip in SVC and no pneumothorax appreciated.    XR CHEST PORTABLE    Result Date: 11/06/2022  Single View Chest: 11/06/22,17:20 History:  S20.429A,Blister (nonthermal) of unspecified back wall of thorax, initial encounter,ICD-10-CM  decreased sat/ possible fluid over;load Comparison: 11/06/2022, 11:43 Findings: Lung volumes are lower. Pulmonary vascularity and reticulonodular opacities are accentuated. Mild cardiomegaly is present with left ventricular prominence. Patient is rotated and tilted to the left.     Impression:Pulmonary venous hypertension.     MRA NECK W CONTRAST    Result Date: 11/06/2022  MRA Neck with gadolinium 11/06/22 COMPARISON: None INDICATION: cva, TECHNIQUE: Gadolinium enhanced 3-D time-of-flight MR angiography of the neck FINDINGS:  Use of NASCET Criteria RIGHT ICA: Right ICA origin shows moderate narrowing, likely not exceeding 30-40%. LEFT ICA: Left ICA origin suggest no significant stenosis, favor mild narrowing.  Small opacified structure at the proximal ICA, cannot exclude ulcerated plaque VERTEBRAL ARTERIES: Vertebral arteries are widely patent in their mid and distal  segments, origins are not well delineated AORTIC ARCH: Arch is limited, proximal brachial cephalic vessels are not well visualized, difficult to differentiate anatomic from artifact     1. MRA neck: Limited by excessive motion artifact Right ICA, mild to moderate  narrowing, likely not exceeding 30-40% Left ICA, mild narrowing at the origin, small vascular structure near the bifurcation,                Cannot exclude 5 mm ulcerated plaque Vertebral arteries are bilaterally patent, proximal segments, origins, as well as the origins of the other brachiocephalic vessels  are not well visualized, cannot differentiate artifact from anatomic    MRI BRAIN WO CONTRAST    Result Date: 11/06/2022  MRI and MRA Brain without contrast: 11/06/22 CLINICAL INDICATION:  CVA. Found on floor, right-sided facial droop, right-sided  weakness with slurred speech TECHNIQUE: Sagittal and axial spin-echo T1-weighted, axial fast spin-echo T2-weighted, FLAIR, diffusion, and gradient echo imaging.Coronal diffusion. 3-D time-of-flight MR angiography of the brain COMPARISON: CT scan November 06, 2022. FINDINGS: MRI BRAIN: No mass or shift of midline structure. Acute left para midline pontine infarct, 14 mm in diameter. Small acute infarct anterior inferior medial right cerebellum, 7 mm. Small acute/subacute lacunar infarct left frontal white matter. Severe underlying small vessel change. Generalized atrophy with prominent temporal lobe involvement.  The major intracranial vessels are patent.  The paranasal sinuses and mastoid air cells are clear. Orbital structures are normal. Craniocervical junction is normal. MRA BRAIN: Distal internal carotid arteries are bilaterally patent. Anterior and middle cerebral arteries are patent. Focal moderate stenosis left P2, severe stenosis left ACA anterior to the genu. The distal vertebral basilar arteries are patent.  Severe stenosis of both posterior cerebral arteries, on the right both the proximal and distal right P2, on the left mid P2 facet.     1. MRI brain 14 mm acute perforator infarct left pons 7 mm acute infarct right anterior inferior cerebellum 4 mm acute/subacute lacunar infarct left frontal, periventricular white matter No hemorrhage, severe underlying small  vessel change Generalized atrophy with prominent temporal lobe involvement 2. MRA brain No large vessel occlusion. Left ACA, severe stenosis Left M2, moderate stenosis Right P2, severe stenosis proximal and distal segment Left P2, severe stenosis mid segment    MRA HEAD WO CONTRAST    Result Date: 11/06/2022  MRI and MRA Brain without contrast: 11/06/22 CLINICAL INDICATION:  CVA. Found on floor, right-sided facial droop, right-sided  weakness with slurred speech TECHNIQUE: Sagittal and axial spin-echo T1-weighted, axial fast spin-echo T2-weighted, FLAIR, diffusion, and gradient echo imaging.Coronal diffusion. 3-D time-of-flight MR angiography of the brain COMPARISON: CT scan November 06, 2022. FINDINGS: MRI BRAIN: No mass or shift of midline structure. Acute left para midline pontine infarct, 14 mm in diameter. Small acute infarct anterior inferior medial right cerebellum, 7 mm. Small acute/subacute lacunar infarct left frontal white matter. Severe underlying small vessel change. Generalized atrophy with prominent temporal lobe involvement.  The major intracranial vessels are patent.  The paranasal sinuses and mastoid air cells are clear. Orbital structures are normal. Craniocervical junction is normal. MRA BRAIN: Distal internal carotid arteries are bilaterally patent. Anterior and middle cerebral arteries are patent. Focal moderate stenosis left P2, severe stenosis left ACA anterior to the genu. The distal vertebral basilar arteries are patent.  Severe stenosis of both posterior cerebral arteries, on the right both the proximal and distal right P2, on the left mid P2 facet.     1. MRI brain 14 mm acute perforator infarct left pons 7 mm acute infarct right anterior inferior cerebellum 4 mm acute/subacute lacunar infarct left frontal, periventricular white matter No hemorrhage, severe underlying small vessel change Generalized atrophy with prominent temporal lobe involvement 2. MRA brain No large vessel occlusion. Left  ACA, severe stenosis Left M2, moderate stenosis Right P2, severe stenosis proximal and distal segment Left P2, severe stenosis mid segment    CT ABDOMEN PELVIS WO CONTRAST Additional Contrast? None    Result Date: 11/06/2022  CT abdomen pelvis without contrast: 11/06/22 INDICATION: low back wound N17.9,Acute kidney failure, unspecified,ICD-10-CM COMPARISON:  None  TECHNIQUE: Routine noncontrast protocol (Axial imaging from the lung bases to the pubic symphysis with coronal reconstruction.) CT scanning was performed using radiation dose reduction techniques when appropriate, per system protocols. FINDINGS: Evaluation of the viscera and vasculature is diminished without the use of IV contrast.. Lower thorax: No pleural or pericardial effusion.  Partially imaged coronary artery calcifications. Mitral annular calcification. Bibasilar atelectasis. Small hiatal hernia. Liver and Biliary:  Liver normal size and contour.   No biliary dilatation.  No gallbladder wall thickening or pericholecystic fluid. No radiopaque gallstones. Pancreas:  Pancreas appears atrophic with some scattered mild calcifications likely sequela of prior pancreatitis without CT evidence of acute pancreatitis at this time. Spleen:  Normal. Adrenal Glands:  Normal.  Urinary system:  No hydronephrosis. 9 mm nonobstructing right kidney stone. Otherwise unremarkable noncontrast appearance the right kidney. Mild diffuse atrophy and scattered cortical scarring left kidney.  2 mm stone in the urinary bladder. Urinary bladder decompressed by Foley catheter.  Pelvic reproductive organs:  Unremarkable.   Peritoneal cavity:  No free air or fluid.  Vascular:  No aortic aneurysm.  Moderate/severe atherosclerosis. Lymph nodes:  None enlarged.  Gastrointestinal:  No bowel obstruction.   Appendix not seen, but there is no pericecal inflammatory change to suggest appendicitis.   Colonic diverticulosis without diverticulitis .  Partially imaged tubing terminates in the  lower rectum. Bones: No definite acute bony findings. Bones appear osteopenic. There are multilevel compression deformities of the lumbar spine and at T12, age-indeterminate but favored to be chronic. Multilevel degenerative changes of the spine. Additional findings, if any: Pelvic floor relaxation..      1. No definite acute findings by noncontrast technique.  2. 9 mm nonobstructing right kidney stone. 2 mm stone urinary bladder. Urinary bladder decompressed by Foley catheter. 3. Pelvic floor relaxation. 4. Bones appear osteopenic. Multilevel compression deformities lumbar spine and at T12 are technically age-indeterminate but favored to be chronic. Correlate for point tenderness.    CT Head W/O Contrast    Result Date: 11/06/2022  CT head without: 11/06/22 INDICATION: AMS N17.9,Acute kidney failure, unspecified,ICD-10-CM COMPARISON: None TECHNIQUE: Noncontrast axial imaging from foramen magnum to the vertex. CT scanning was performed using radiation dose reduction techniques when appropriate, per system protocols. FINDINGS: No intra-axial or extra axial hemorrhage. No extra-axial fluid collection. No intra-axial mass effect or midline shift. Bilateral cerebellar lacunar infarcts. Elsewhere Preservation of gray-white differentiation. Mild atrophy with increased sulcation and ex-vacuo dilatation of the ventricles. No hydrocephalus. Moderate/severe white matter hypodensities, predominantly periventricular, nonspecific but are likely chronic microangiopathic ischemic changes. Suprasellar and quadrigeminal plate cisterns are patent, without herniation. No soft tissue abnormality, fracture or osseous lesion. Visualized portions of the orbits are normal. Included paranasal sinuses and mastoid air cells are predominantly clear.     No acute intracranial abnormality.   Chronic findings as above.    XR CHEST PORTABLE    Result Date: 11/06/2022  Chest AP: 11/06/22 INDICATION: Altered Mental Status. COMPARISON: None available for  review. FINDINGS: Lungs/pleura: Consolidative opacities in the right midlung and left lung base. No pleural effusion or pneumothorax. Cardiomediastinum: Unremarkable. Bones/ Soft tissues: No acute osseous abnormality.     Consolidative opacities in the right midlung and left lung base. Findings could reflect pneumonia in the appropriate clinical context, however underlying nodule  not excluded. At a minimum, recommend short-term follow-up two-view chest radiograph in 4-6 weeks for further evaluation.         Assessment        Hospital Problems  Last Modified POA    * (Principal) Altered mental status 11/06/2022 Yes    AKI (acute kidney injury) ([place]) 11/07/2022 Yes    Dehydration 11/07/2022 Yes    Palliative care by specialist 11/07/2022 Yes    Acute embolic stroke ([place]) [id number] Yes    Hemiplegia and hemiparesis following cerebral infarction affecting right dominant side ([place]) 11/08/2022 Yes     Plan:        CVA with Right facial droop, right hemiplegia, Dysphagia   - Known left carotid stenosis  - MRI, MRA head and neck showed no LVO. imaging shows several areas of stroke  - Continue aspirin  - Echo 3/8: EF 65-70%  - Neuro following  - Speech therapy, PT, OT, PPD    Metabolic encephalopathy  -May have been down as long as 6 days  - Unclear if fall was caused by a stroke or if patient may subsequently had a stroke while lying on the ground     Hypernatremia  - Na 152, FWF increased again, now up to 150 every hour     Aspiration pneumonia with severe sepsis +/- hypovolemic shock  -Continue broad-spectrum antibiotics Zosyn and vancomycin.    -Blood cultures remain negative  - No longer requiring pressor therapy  - Needs follow-up chest imaging as radiology reported they could not exclude underlying nodule     AKI  - Admitted with acute kidney injury.    -Continue IV fluids  - Creat is downtrending    Rhabdomyolysis  - patient found down at home, likely down for approx 6 days according to family  - CPK peaked at 18  and is down trending  - lactic acidosis resolved     Sacral wound with eschar, present on admission  - approximately 13 x10 cm eschar in sacral area with fluctuance  - Due to prolonged downtime  - Wound care following  - General surgery consulted and plan for debridement in OR on 3/11     Elevated troponin, aortic valve stenosis  - Likely stress-induced ischemia  - Echo: Mod Aortic Valve stenosis. AVA 1.6 cm2, peak AV grad 23, severe mitral annular calcification     Chronic compression fracture  - Patient with chronic back pain and some debility  - PT, OT     Multiple medical comorbidities  -DO NOT RESUSCITATE CODE STATUS  -Followed by palliative care    Dispo: appropriate for floor transfer  Medical Decision maker: Extended Emergency Contact Information  Primary Emergency Contact: son Rudean Curt.   Mobile Phone: [phone]  Secondary Emergency Contact: Pax  Mobile Phone: [phone]  PCP: Leafy Ro, MD     MD addendum:    I assessed and examined the patient, and I agree with the note above.    I spoke with the family at the bedside.    Awake but sluggish, no distress  Oropharynx clear  Regular rate and rhythm  Clear to auscultation  Abdomen soft, nontender, nondistended, with normal bowel sounds  Extremities without edema    Plans:    1.  Continue to monitor renal function.  Urine output is adequate, and BUN is improving.    2.  Increase free water intake for hypernatremia again.    3.  Continue empiric antibiotic for positive blood culture.    4.  Initiate PT, OT, SLP when appropriate.    5.  Plans for surgical intervention for decubitus ulcer 11/10/2022.    Condition continues to improve.  Patient may be appropriate  for transfer out of the intensive care unit, and we will contact the hospitalist group to assess.    Graceann Congress Rogelio Seen, MD, made rounds with the multidisciplinary team, examined the patient, and developed the assessment and plan.     To this point today, I have spent  35 minutes providing critical care management for this patient.

## 2022-11-10 ENCOUNTER — Inpatient Hospital Stay: Admit: 2022-11-11 | Payer: PRIVATE HEALTH INSURANCE | Primary: Internal Medicine

## 2022-11-10 LAB — BASIC METABOLIC PANEL W/ REFLEX TO MG FOR LOW K
Anion Gap: 12 mmol/L (ref 2–17)
BUN: 87 mg/dL — ABNORMAL HIGH (ref 8–23)
CO2: 22 mmol/L (ref 22–29)
Calcium: 8.2 mg/dL — ABNORMAL LOW (ref 8.8–10.2)
Chloride: 115 mmol/L — ABNORMAL HIGH (ref 98–107)
Creatinine: 1.6 mg/dL — ABNORMAL HIGH (ref 0.5–1.0)
Est, Glom Filt Rate: 31 mL/min/1.73m — ABNORMAL LOW (ref >=60)
Glucose: 125 mg/dL — ABNORMAL HIGH (ref 70–99)
OSMOLALITY CALCULATED: 323 mOsm/kg — ABNORMAL HIGH (ref 270–287)
Potassium: 3.9 mmol/L (ref 3.5–5.3)
Sodium: 149 mmol/L — ABNORMAL HIGH (ref 135–145)

## 2022-11-10 LAB — CK: Total CK: 73 U/L (ref 20–180)

## 2022-11-10 LAB — CBC WITH AUTO DIFFERENTIAL
Absolute Baso #: 0.1 10*3/uL (ref 0.0–0.2)
Absolute Eos #: 0.2 10*3/uL (ref 0.0–0.5)
Absolute Lymph #: 1.2 10*3/uL (ref 1.0–3.2)
Absolute Mono #: 1.5 10*3/uL — ABNORMAL HIGH (ref 0.3–1.0)
Basophils %: 0.4 % (ref 0.0–2.0)
Eosinophils %: 1.4 % (ref 0.0–7.0)
Hematocrit: 32.1 % — ABNORMAL LOW (ref 34.0–47.0)
Hemoglobin: 9.9 g/dL — ABNORMAL LOW (ref 11.5–15.7)
Immature Grans (Abs): 0.47 10*3/uL — ABNORMAL HIGH (ref 0.00–0.06)
Immature Granulocytes: 3.4 % — ABNORMAL HIGH (ref 0.0–0.6)
Lymphocytes: 8.3 % — ABNORMAL LOW (ref 15.0–45.0)
MCH: 28.6 pg (ref 27.0–34.5)
MCHC: 30.8 g/dL — ABNORMAL LOW (ref 32.0–36.0)
MCV: 92.8 fL (ref 81.0–99.0)
MPV: 11.1 fL (ref 7.2–13.2)
Monocytes: 10.5 % (ref 4.0–12.0)
NRBC Absolute: 0 10*3/uL (ref 0.000–0.012)
NRBC Automated: 0 % (ref 0.0–0.2)
Neutrophils %: 76 % — ABNORMAL HIGH (ref 42.0–74.0)
Neutrophils Absolute: 10.6 10*3/uL — ABNORMAL HIGH (ref 1.6–7.3)
Platelets: 202 10*3/uL (ref 140–440)
RBC: 3.46 x10e6/mcL — ABNORMAL LOW (ref 3.60–5.20)
RDW: 14.6 % (ref 11.0–16.0)
WBC: 13.9 10*3/uL — ABNORMAL HIGH (ref 3.8–10.6)

## 2022-11-10 LAB — BASIC METABOLIC PANEL
Anion Gap: 10 mmol/L (ref 2–17)
BUN: 85 mg/dL — ABNORMAL HIGH (ref 8–23)
CO2: 23 mmol/L (ref 22–29)
Calcium: 8.2 mg/dL — ABNORMAL LOW (ref 8.8–10.2)
Chloride: 115 mmol/L — ABNORMAL HIGH (ref 98–107)
Creatinine: 1.5 mg/dL — ABNORMAL HIGH (ref 0.5–1.0)
Est, Glom Filt Rate: 34 mL/min/1.73m — ABNORMAL LOW (ref >=60)
Glucose: 122 mg/dL — ABNORMAL HIGH (ref 70–99)
OSMOLALITY CALCULATED: 321 mOsm/kg — ABNORMAL HIGH (ref 270–287)
Potassium: 3.6 mmol/L (ref 3.5–5.3)
Sodium: 148 mmol/L — ABNORMAL HIGH (ref 135–145)

## 2022-11-10 LAB — CBC
Hematocrit: 33.5 % — ABNORMAL LOW (ref 34.0–47.0)
Hemoglobin: 10.5 g/dL — ABNORMAL LOW (ref 11.5–15.7)
MCH: 28.6 pg (ref 27.0–34.5)
MCHC: 31.3 g/dL — ABNORMAL LOW (ref 32.0–36.0)
MCV: 91.3 fL (ref 81.0–99.0)
MPV: 11.1 fL (ref 7.2–13.2)
NRBC Absolute: 0 10*3/uL (ref 0.000–0.012)
NRBC Automated: 0 % (ref 0.0–0.2)
Platelets: 191 10*3/uL (ref 140–440)
RBC: 3.67 x10e6/mcL (ref 3.60–5.20)
RDW: 14.6 % (ref 11.0–16.0)
WBC: 12.9 10*3/uL — ABNORMAL HIGH (ref 3.8–10.6)

## 2022-11-10 LAB — LIPID PANEL
Chol/HDL Ratio: 3.6 (ref 0.0–4.4)
Cholesterol: 111 mg/dL (ref 100–200)
HDL: 31 mg/dL — ABNORMAL LOW (ref >=50)
LDL Cholesterol: 50 mg/dL (ref 0.0–100.0)
LDL/HDL Ratio: 1.6
Triglycerides: 152 mg/dL — ABNORMAL HIGH (ref 0–149)
VLDL: 30.4 mg/dL (ref 5.0–40.0)

## 2022-11-10 LAB — PHOSPHORUS: Phosphorus: 2.6 mg/dL (ref 2.5–4.5)

## 2022-11-10 LAB — HEPATIC FUNCTION PANEL
ALT: 28 U/L (ref 0–35)
AST: 24 U/L (ref 0–35)
Albumin: 2.3 g/dL — ABNORMAL LOW (ref 3.5–5.2)
Alk Phosphatase: 66 U/L (ref 35–117)
Bilirubin, Direct: 0.2 mg/dL (ref 0.00–0.30)
Bilirubin, Indirect: 0.24 mg/dL (ref 0.00–1.00)
Total Bilirubin: 0.44 mg/dL (ref 0.00–1.20)
Total Protein: 5.2 g/dL — ABNORMAL LOW (ref 6.4–8.3)

## 2022-11-10 LAB — MAGNESIUM: Magnesium: 2.2 mg/dL (ref 1.6–2.6)

## 2022-11-10 MED ORDER — PHENYLEPHRINE HCL 1 MG/10ML IV SOSY
1 | INTRAVENOUS | Status: AC
Start: 2022-11-10 — End: ?

## 2022-11-10 MED ORDER — SUGAMMADEX SODIUM 200 MG/2ML IV SOLN
200 | INTRAVENOUS | Status: AC
Start: 2022-11-10 — End: 2022-11-10

## 2022-11-10 MED ORDER — DEXTROSE 10 % IV BOLUS
INTRAVENOUS | Status: DC | PRN
Start: 2022-11-10 — End: 2022-11-10

## 2022-11-10 MED ORDER — EPINEPHRINE (ANAPHYLAXIS) 30 MG/30ML IJ SOLN
3030 MG/ML | INTRAMUSCULAR | Status: DC
Start: 2022-11-10 — End: 2022-11-11

## 2022-11-10 MED ORDER — ONDANSETRON HCL 4 MG/2ML IJ SOLN
4 | Freq: Once | INTRAMUSCULAR | Status: DC | PRN
Start: 2022-11-10 — End: 2022-11-10

## 2022-11-10 MED ORDER — GLUCOSE 4 G PO CHEW
4 | ORAL | Status: DC | PRN
Start: 2022-11-10 — End: 2022-11-10

## 2022-11-10 MED ORDER — DEXTROSE 10 % IV SOLN
10 | INTRAVENOUS | Status: DC | PRN
Start: 2022-11-10 — End: 2022-11-10

## 2022-11-10 MED ORDER — HYDROMORPHONE HCL 1 MG/ML IJ SOLN
1 | INTRAMUSCULAR | Status: DC | PRN
Start: 2022-11-10 — End: 2022-11-10

## 2022-11-10 MED ORDER — OXYCODONE HCL 5 MG PO TABS
5 | Freq: Once | ORAL | Status: DC | PRN
Start: 2022-11-10 — End: 2022-11-10

## 2022-11-10 MED ORDER — EPINEPHRINE (ANAPHYLAXIS) 30 MG/30ML IJ SOLN
30 | INTRAMUSCULAR | Status: DC | PRN
Start: 2022-11-10 — End: 2022-11-10
  Administered 2022-11-10: 18:00:00 15 via TOPICAL

## 2022-11-10 MED ORDER — FENTANYL CITRATE (PF) 100 MCG/2ML IJ SOLN
100 | INTRAMUSCULAR | Status: AC
Start: 2022-11-10 — End: ?

## 2022-11-10 MED ORDER — ATORVASTATIN CALCIUM 40 MG PO TABS
40 MG | Freq: Every evening | ORAL | Status: DC
Start: 2022-11-10 — End: 2022-11-11
  Administered 2022-11-11: 01:00:00 40 mg via ORAL

## 2022-11-10 MED ORDER — PROPOFOL 200 MG/20ML IV EMUL
200 | INTRAVENOUS | Status: AC
Start: 2022-11-10 — End: ?

## 2022-11-10 MED ORDER — ROCURONIUM BROMIDE 50 MG/5ML IV SOLN
50 | INTRAVENOUS | Status: AC
Start: 2022-11-10 — End: ?

## 2022-11-10 MED ORDER — LIDOCAINE HCL (PF) 2 % IJ SOLN
2 % | INTRAMUSCULAR | Status: DC | PRN
  Administered 2022-11-10: 18:00:00 40 via INTRAVENOUS

## 2022-11-10 MED ORDER — LACTATED RINGERS IV SOLN
INTRAVENOUS | Status: DC
Start: 2022-11-10 — End: 2022-11-11

## 2022-11-10 MED ORDER — LACTATED RINGERS IV SOLN
INTRAVENOUS | Status: DC | PRN
  Administered 2022-11-10: 18:00:00 via INTRAVENOUS

## 2022-11-10 MED ORDER — FENTANYL CITRATE (PF) 100 MCG/2ML IJ SOLN
100 | INTRAMUSCULAR | Status: DC | PRN
Start: 2022-11-10 — End: 2022-11-10

## 2022-11-10 MED ORDER — SUGAMMADEX SODIUM 200 MG/2ML IV SOLN
200 MG/2ML | INTRAVENOUS | Status: DC | PRN
  Administered 2022-11-10: 18:00:00 200 via INTRAVENOUS

## 2022-11-10 MED ORDER — ROCURONIUM BROMIDE 50 MG/5ML IV SOLN
50 MG/5ML | INTRAVENOUS | Status: DC | PRN
  Administered 2022-11-10: 18:00:00 50 via INTRAVENOUS

## 2022-11-10 MED ORDER — PROPOFOL 200 MG/20ML IV EMUL
200 MG/20ML | INTRAVENOUS | Status: DC | PRN
  Administered 2022-11-10: 18:00:00 100 via INTRAVENOUS

## 2022-11-10 MED ORDER — ONDANSETRON HCL 4 MG/2ML IJ SOLN
4 MG/2ML | INTRAMUSCULAR | Status: DC | PRN
  Administered 2022-11-10: 18:00:00 4 via INTRAVENOUS

## 2022-11-10 MED ORDER — SODIUM CHLORIDE (PF) 0.9 % IJ SOLN
0.9 | INTRAMUSCULAR | Status: DC | PRN
Start: 2022-11-10 — End: 2022-11-10

## 2022-11-10 MED ORDER — HALOPERIDOL LACTATE 5 MG/ML IJ SOLN
5 | Freq: Once | INTRAMUSCULAR | Status: DC | PRN
Start: 2022-11-10 — End: 2022-11-10

## 2022-11-10 MED ORDER — LIDOCAINE HCL (PF) 2 % IJ SOLN
2 | INTRAMUSCULAR | Status: AC
Start: 2022-11-10 — End: ?

## 2022-11-10 MED ORDER — FENTANYL CITRATE (PF) 100 MCG/2ML IJ SOLN
100 MCG/2ML | INTRAMUSCULAR | Status: DC | PRN
  Administered 2022-11-10: 18:00:00 50 via INTRAVENOUS

## 2022-11-10 MED ORDER — PHENYLEPHRINE HCL 1 MG/10ML IV SOSY
1 MG/10ML | INTRAVENOUS | Status: DC | PRN
  Administered 2022-11-10 (×5): 200 via INTRAVENOUS

## 2022-11-10 MED ORDER — GLUCAGON EMERGENCY 1 MG/ML IJ SOLR
1 | INTRAMUSCULAR | Status: DC | PRN
Start: 2022-11-10 — End: 2022-11-10

## 2022-11-10 MED FILL — FENTANYL CITRATE (PF) 100 MCG/2ML IJ SOLN: 100 MCG/2ML | INTRAMUSCULAR | Qty: 2

## 2022-11-10 MED FILL — HEPARIN SODIUM (PORCINE) 5000 UNIT/ML IJ SOLN: 5000 UNIT/ML | INTRAMUSCULAR | Qty: 1

## 2022-11-10 MED FILL — PIPERACILLIN SOD-TAZOBACTAM SO 3.375 (3-0.375) G IV SOLR: 3.375 (3-0.375) g | INTRAVENOUS | Qty: 3375

## 2022-11-10 MED FILL — XYLOCAINE-MPF 2 % IJ SOLN: 2 % | INTRAMUSCULAR | Qty: 5

## 2022-11-10 MED FILL — ADRENALIN 30 MG/30ML IJ SOLN: 30 MG/ML | INTRAMUSCULAR | Qty: 30

## 2022-11-10 MED FILL — TRAMADOL HCL 50 MG PO TABS: 50 MG | ORAL | Qty: 1

## 2022-11-10 MED FILL — NORMAL SALINE FLUSH 0.9 % IV SOLN: 0.9 % | INTRAVENOUS | Qty: 30

## 2022-11-10 MED FILL — PROPOFOL 200 MG/20ML IV EMUL: 200 MG/20ML | INTRAVENOUS | Qty: 20

## 2022-11-10 MED FILL — NORMAL SALINE FLUSH 0.9 % IV SOLN: 0.9 % | INTRAVENOUS | Qty: 10

## 2022-11-10 MED FILL — BRIDION 200 MG/2ML IV SOLN: 200 MG/2ML | INTRAVENOUS | Qty: 2

## 2022-11-10 MED FILL — PHENYLEPHRINE HCL (PRESSORS) 1 MG/10ML IV SOSY: 1 MG/0ML | INTRAVENOUS | Qty: 10

## 2022-11-10 MED FILL — ROCURONIUM BROMIDE 50 MG/5ML IV SOLN: 50 MG/5ML | INTRAVENOUS | Qty: 5

## 2022-11-10 NOTE — Plan of Care (Signed)
Problem: Discharge Planning  Goal: Discharge to home or other facility with appropriate resources  Outcome: Not Progressing

## 2022-11-10 NOTE — Progress Notes (Signed)
Infectious Diseases Progress Note      Subjective  Resting comfortably in bed. Currently on RA.    Medications:  Current Facility-Administered Medications   Medication Dose Route Frequency Provider Last Rate Last Admin    piperacillin-tazobactam (ZOSYN) 3,375 mg in sodium chloride 0.9 % 100 mL IVPB (mini-bag)  3,375 mg IntraVENous Q8H Otilio Connors, MD   Stopped at 11/10/22 1053    aspirin chewable tablet 81 mg  81 mg Per NG tube Daily Senaida Ores, PA   81 mg at 11/09/22 [id number]    traMADol (ULTRAM) tablet 50 mg  50 mg Oral Q6H PRN Nuss, Dayton Martes, APRN - NP   50 mg at 11/10/22 1046    sodium chloride flush 0.9 % injection 5-40 mL  5-40 mL IntraVENous 2 times per day Algis Liming, MD   10 mL at 11/10/22 1010    sodium chloride flush 0.9 % injection 5-40 mL  5-40 mL IntraVENous PRN Algis Liming, MD   10 mL at 11/09/22 2107    0.9 % sodium chloride infusion   IntraVENous PRN Algis Liming, MD        ondansetron (ZOFRAN-ODT) disintegrating tablet 4 mg  4 mg Oral Q6H PRN Algis Liming, MD        Or    ondansetron [facility]) injection 4 mg  4 mg IntraVENous Q4H PRN Algis Liming, MD        bisacodyl (DULCOLAX) EC tablet 5 mg  5 mg Oral Daily PRN Algis Liming, MD        senna [facility]) tablet 8.6 mg  1 tablet Oral Daily PRN Algis Liming, MD        aluminum & magnesium hydroxide-simethicone (MAALOX) [id number] MG/5ML suspension 30 mL  30 mL Oral Q6H PRN Algis Liming, MD        acetaminophen (TYLENOL) tablet 650 mg  650 mg Oral Q6H PRN Algis Liming, MD   650 mg at 11/09/22 1805    Or    acetaminophen (TYLENOL) suppository 650 mg  650 mg Rectal Q6H PRN Algis Liming, MD        heparin (porcine) injection 5,000 Units  5,000 Units SubCUTAneous 3 times per day Algis Liming, MD   5,000 Units at 11/10/22 [id number]      Review of Systems:  Limited due to AMS.    Objective:  Physical Exam:  BP (!) 166/67   Pulse 77    Temp 97.1 F (36.2 C) (Axillary)   Resp 20   Ht 1.73 m (5' 8.11")   Wt 100.4 kg (221 lb 5.5 oz)   SpO2 97%   BMI 33.55 kg/m   General: Comfortable  Neck: Supple, non-tender, no carotid bruits, no JVD, no lymphadenopathy.  Lungs: Clear to auscultation and percussion, non-labored respiration.  Heart: Normal rate, regular rhythm, no murmur, gallop or edema.  Abdomen: Soft, non-tender, non-distended, normal bowel sounds, no masses.  Musculoskeletal: Normal range of motion and strength, no tenderness or swelling.  Skin: Skin is warm, dry, no rashes or lesions.  Dobhoff, sacral decub, R IJ, foley     Lab Results:  Lab Results   Component Value Date/Time    WBC 13.9 11/10/2022 06:42 AM    RBC 3.46 11/10/2022 06:42 AM    HGB 9.9 11/10/2022 06:42 AM    HCT 32.1 11/10/2022 06:42 AM    PLT 202 11/10/2022 06:42 AM    MCV 92.8 11/10/2022 06:42 AM  LYMPHOPCT 8.3 11/10/2022 06:42 AM     Lab Results   Component Value Date/Time    NA 149 11/10/2022 06:42 AM    K 3.9 11/10/2022 06:42 AM    CL 115 11/10/2022 06:42 AM    CO2 22 11/10/2022 06:42 AM    BUN 87 11/10/2022 06:42 AM    CREATININE 1.6 11/10/2022 06:42 AM    CALCIUM 8.2 11/10/2022 06:42 AM    LABGLOM 31 11/10/2022 06:42 AM    GLUCOSE 125 11/10/2022 06:42 AM       Diagnostic Results:      Assessment/Plan:    Polymicrobial bacteremia.    11/06/22 Bcx x1 proteus mirabilis, other Bcx x1 globicatella, aerococcus, paenalicalignes.     Suspected source sacral decub  Repeat blood culture pending  TTE negative  Recc empiric zosyn.     AKI.    Antimicrobial properly dosed.     Sacral decubitus.    MRSA screen negative  Wound care.     Possible pneumonia.    CXR noted.  Zosyn adequate coverage.           Fransico Him, DO, MBA, FACP, [place]

## 2022-11-10 NOTE — Consults (Signed)
Palliative care re-consult noted. We are following this patient and note plan for surgical debridement today. They were last seen on Friday. We will continue to follow for ongoing goals of care discussions and support.     Campbell Stall, NP

## 2022-11-10 NOTE — Anesthesia Pre-Procedure Evaluation (Signed)
Department of Anesthesiology  Preprocedure Note       Name:  Ashley Davies   Age:  87 y.o.  DOB:  Feb 05, 1936                                          MRN:  [id number]         Date:  11/10/2022      Surgeon: Juliann Mule):  Sula Soda, MD    Procedure: Procedure(s):  SACRAL WOUND DEBRIDEMENT    Medications prior to admission:   Prior to Admission medications    Medication Sig Start Date End Date Taking? Authorizing Provider   Mirabegron (MYRBETRIQ PO) Take by mouth    [provider]   psyllium 0.52 g capsule Take 2 capsules by mouth in the morning and at bedtime 02/11/22   Darlina Guys, APRN - NP       Current medications:    Current Facility-Administered Medications   Medication Dose Route Frequency Provider Last Rate Last Admin    piperacillin-tazobactam (ZOSYN) 3,375 mg in sodium chloride 0.9 % 100 mL IVPB (mini-bag)  3,375 mg IntraVENous Q8H Otilio Connors, MD   Stopped at 11/10/22 1053    aspirin chewable tablet 81 mg  81 mg Per NG tube Daily Senaida Ores, PA   81 mg at 11/09/22 [id number]    traMADol (ULTRAM) tablet 50 mg  50 mg Oral Q6H PRN Nuss, Dayton Martes, APRN - NP   50 mg at 11/10/22 1046    sodium chloride flush 0.9 % injection 5-40 mL  5-40 mL IntraVENous 2 times per day Algis Liming, MD   10 mL at 11/10/22 1010    sodium chloride flush 0.9 % injection 5-40 mL  5-40 mL IntraVENous PRN Algis Liming, MD   10 mL at 11/09/22 2107    0.9 % sodium chloride infusion   IntraVENous PRN Algis Liming, MD        ondansetron (ZOFRAN-ODT) disintegrating tablet 4 mg  4 mg Oral Q6H PRN Algis Liming, MD        Or    ondansetron [facility]) injection 4 mg  4 mg IntraVENous Q4H PRN Algis Liming, MD        bisacodyl (DULCOLAX) EC tablet 5 mg  5 mg Oral Daily PRN Algis Liming, MD        senna [facility]) tablet 8.6 mg  1 tablet Oral Daily PRN Algis Liming, MD        aluminum & magnesium hydroxide-simethicone (MAALOX) [id number] MG/5ML  suspension 30 mL  30 mL Oral Q6H PRN Algis Liming, MD        acetaminophen (TYLENOL) tablet 650 mg  650 mg Oral Q6H PRN Algis Liming, MD   650 mg at 11/09/22 1805    Or    acetaminophen (TYLENOL) suppository 650 mg  650 mg Rectal Q6H PRN Algis Liming, MD        heparin (porcine) injection 5,000 Units  5,000 Units SubCUTAneous 3 times per day Algis Liming, MD   5,000 Units at 11/10/22 [id number]       Allergies:  No Known Allergies    Problem List:    Patient Active Problem List   Diagnosis Code    Carotid stenosis, asymptomatic, unspecified laterality I65.29    Lumbar spondylosis M47.816    Stenosis  of left carotid artery I65.22    Aortic valve sclerosis I35.8    Chronic lumbar radiculopathy M54.16    Altered mental status R41.82    AKI (acute kidney injury) ([place]) N17.9    Dehydration E86.0    Palliative care by specialist [id number]    Acute embolic stroke ([place]) 123XX123    Hemiplegia and hemiparesis following cerebral infarction affecting right dominant side ([place]) I69.351       Past Medical History:        Diagnosis Date    Hx of spinal cord injury     Leaking of urine        Past Surgical History:        Procedure Laterality Date    LITHOTRIPSY      TONSILLECTOMY AND ADENOIDECTOMY         Social History:    Social History     Tobacco Use    Smoking status: Never    Smokeless tobacco: Never   Substance Use Topics    Alcohol use: Never                                Counseling given: Not Answered      Vital Signs (Current):   Vitals:    11/10/22 0445 11/10/22 0744 11/10/22 1116 11/10/22 1200   BP: (!) 177/72 (!) 166/67  (!) 172/70   Pulse: 80 77  80   Resp: '20 20 20 12   '$ Temp: 97.4 F (36.3 C) 97.1 F (36.2 C)  96.8 F (36 C)   TempSrc: Axillary Axillary  Axillary   SpO2: 96% 97%  97%   Weight:       Height:                                                  BP Readings from Last 3 Encounters:   11/10/22 (!) 172/70   04/26/22 (!) 144/84   03/06/22 (!) 189/78       NPO Status:                                                                                  BMI:   Wt Readings from Last 3 Encounters:   11/09/22 100.4 kg (221 lb 5.5 oz)   04/26/22 86.2 kg (190 lb 0.6 oz)   02/11/22 88.2 kg (194 lb 8 oz)     Body mass index is 33.55 kg/m.    CBC:   Lab Results   Component Value Date/Time    WBC 13.9 11/10/2022 06:42 AM    RBC 3.46 11/10/2022 06:42 AM    HGB 9.9 11/10/2022 06:42 AM    HCT 32.1 11/10/2022 06:42 AM    MCV 92.8 11/10/2022 06:42 AM    RDW 14.6 11/10/2022 06:42 AM    PLT 202 11/10/2022 06:42 AM       CMP:   Lab Results   Component Value Date/Time    NA 149 11/10/2022 06:42 AM  K 3.9 11/10/2022 06:42 AM    CL 115 11/10/2022 06:42 AM    CO2 22 11/10/2022 06:42 AM    BUN 87 11/10/2022 06:42 AM    CREATININE 1.6 11/10/2022 06:42 AM    AGRATIO 0.80 11/09/2022 05:09 AM    LABGLOM 31 11/10/2022 06:42 AM    GLUCOSE 125 11/10/2022 06:42 AM    PROT 5.2 11/10/2022 04:58 AM    CALCIUM 8.2 11/10/2022 06:42 AM    BILITOT 0.44 11/10/2022 04:58 AM    ALKPHOS 66 11/10/2022 04:58 AM    AST 24 11/10/2022 04:58 AM    ALT 28 11/10/2022 04:58 AM       POC Tests:   Recent Labs     11/08/22  1221   POCGLU 137.0*       Coags: No results found for: "PROTIME", "INR", "APTT"    HCG (If Applicable): No results found for: "PREGTESTUR", "PREGSERUM", "HCG", "HCGQUANT"     ABGs:   Lab Results   Component Value Date/Time    PHART 7.337 11/06/2022 05:34 PM    PO2ART 322.0 11/06/2022 05:34 PM    PCO2ART 38.4 11/06/2022 05:34 PM    HCO3ART 20.6 11/06/2022 05:34 PM    BEART -4.8 11/06/2022 05:34 PM    O2SATART >99.2 11/06/2022 05:34 PM        Type & Screen (If Applicable):  No results found for: "LABABO", "LABRH"    Drug/Infectious Status (If Applicable):  No results found for: "HIV", "HEPCAB"    COVID-19 Screening (If Applicable): No results found for: "COVID19"        Anesthesia Evaluation    Airway: Mallampati: III  TM distance: <3 FB   Neck ROM: limited  Mouth opening: > = 3 FB   Dental: normal exam         Pulmonary:                              PE comment: Non-labored Cardiovascular:    (+) valvular problems/murmurs: AS        Rhythm: regular  Rate: normal                    Neuro/Psych:   (+) CVA: residual symptoms, neuromuscular disease:, TIA             ROS comment: L. Sided Carotid stenosis. S/p L. Sided acute embolic stroke with R. Sided hemiparesis GI/Hepatic/Renal:             Endo/Other:                     Abdominal:   (+) obese          Vascular:   + PVD, aortic or cerebral.       Other Findings: Patient awake, somnolent but oreinted. R. Hemiparesis upper and lower extremity. Focused physical exam as above.             Anesthesia Plan      general     ASA 3       Induction: intravenous.    MIPS: Postoperative opioids intended and Prophylactic antiemetics administered.  Anesthetic plan and risks discussed with patient and child/children.        Attending anesthesiologist reviewed and agrees with Preprocedure content                Hollice Espy, MD   11/10/2022

## 2022-11-10 NOTE — Anesthesia Post-Procedure Evaluation (Signed)
Department of Anesthesiology  Postprocedure Note    Patient: Ashley Davies  MRN: [id number]  Birthdate: 05/31/1936  Date of evaluation: 11/10/2022    Procedure Summary       Date: 11/10/22 Room / Location: [facility] OR 05 / [facility] MAIN OR    Anesthesia Start: 1331 Anesthesia Stop: [id number]    Procedure: SACRAL WOUND DEBRIDEMENT Diagnosis:       Infected abrasion of buttock, initial encounter      (Infected abrasion of buttock, initial encounter [S30.810A, L08.9])    Surgeons: Sula Soda, MD Responsible Provider: Hollice Espy, MD    Anesthesia Type: General ASA Status: 3            Anesthesia Type: General    Aldrete Phase I: Aldrete Score: 7    Aldrete Phase II:      Anesthesia Post Evaluation    Patient location during evaluation: PACU  Patient participation: complete - patient participated  Level of consciousness: sleepy but conscious  Airway patency: patent  Nausea & Vomiting: no nausea and no vomiting  Cardiovascular status: hemodynamically stable  Respiratory status: acceptable, nonlabored ventilation, spontaneous ventilation and nasal cannula  Hydration status: euvolemic  Comments: Vital signs in PACU reviewed by anesthesia provider and documented in Handoff.  Multimodal analgesia pain management approach  Pain management: adequate    No notable events documented.

## 2022-11-10 NOTE — Consults (Signed)
Labs obtained using US guidance. No complications. Pt tolerated well.

## 2022-11-10 NOTE — Op Note (Signed)
Operative Note      Patient: Ashley Davies  Date of Birth: Aug 05, 1936  MRN: [id number]    Date of Procedure:   11/10/2022    Indication for Surgery:  87 year old female who suffered a cerebrovascular accident and was found down for an unknown length of time.  She developed a unstageable ulcer to her sacrum and lower back.  She has stabilized medically from her sepsis.  Excision of the wound is indicated.  The risk and benefits of the procedure were discussed with the patient and the patient's family.  She agreed and wished proceed.    Pre-Op Diagnosis:   Infected abrasion of buttock, initial encounter [S30.810A, L08.9]    Post-Op Diagnosis:   Same       Procedure(s):  SACRAL WOUND DEBRIDEMENT    Surgeon(s):  Sula Soda, MD    Assistant:   * No surgical staff found *    Anesthesia:   General    Estimated Blood Loss:   [id number]    Complications:   None    Specimens:   ID Type Source Tests Collected by Time Destination   1 : back wound for culture Swab Back CULTURE, WOUND, CULTURE, WOUND, AEROBIC AND ANAEROBIC Sula Soda, MD 11/10/2022 1405    A : back wound Tissue Back SURGICAL PATHOLOGY Sula Soda, MD 11/10/2022 1403        Implants:  * No implants in log *      Drains:   NG/OG/NJ/NE Tube Nasoenteric feeding tube Left nostril (Active)   Surrounding Skin Clean, dry & intact 11/09/22 2100   Securement device Bridle 11/09/22 2100   Status Continuous feeding 11/09/22 2100   NG/OG/NJ/NE External Measurement (cm) 78 cm 11/09/22 0747   Tube Feeding Other Tube Feeding (specify) 11/09/22 2100   Tube feeding/verify rate (mL/hr) 25 mL/hr 11/09/22 2100   Tube Feeding Intake (mL) 100 ml 11/10/22 0000   Free Water/Flush (mL) 40 mL 11/10/22 0445   Action Taken Feed set changed;Tubing changed 11/09/22 1830       Urinary Catheter 11/06/22 2 Way (Active)   Catheter Indications Assist in healing of open sacral or perineal wounds (Stage III, IV, or unstageable documented by a provider or enterostomal  therapy) and/or full thickness perineal and lower extremity burns in incontinent patients 11/10/22 0800   Site Assessment Pink;Moist 11/10/22 0800   Urine Color Yellow 11/10/22 0445   Urine Appearance Clear 11/10/22 0445   Collection Container Standard 11/09/22 2100   Securement Method Securing device (Describe) 11/09/22 2100   Catheter Care  Perineal wipes 11/09/22 0400   Catheter Best Practices  Drainage tube clipped to bed;Catheter secured to thigh;Bag below bladder;Bag not on floor;Lack of dependent loop in tubing;Drainage bag less than half full 11/09/22 2100   Status Draining;Patent 11/09/22 2100   Output (mL) 900 mL 11/10/22 1310       Findings:   15 x 14 cm stage IV decubitus extending all the way down to bone.    Detailed Description of Procedure:  After informed consent was obtained the patient was brought to the operating room placed in the supine position.  She underwent general endotracheal anesthesia.  She was then placed in the prone position and appropriately padded.  The skin overlying the sacrum was prepped and draped in normal sterile fashion with ChloraPrep.  A standard timeout was performed.    There was a large eschar that was boggy.  It was excised sharply with heavy scissors  circumferentially.  Cautery was then used to dissect through subcutaneous tissue to healthy tissue.  Skin, subcutaneous tissue, and muscle were excised sharply.  The wound went all the way down to the sacrum and there was exposed sacrum.  The muscle was necrotic near the sacrum circumferentially.  Further out from the sacrum the soft tissue was not necrotic and was better.  The fatty tissue superficially had broken down.  A large swath of tissue was sent for path.  A portion of the deeper tissue was sent for culture.  The total excision size was 15 x 14 cm or 210 cm.  The wound was made hemostatic with cautery.  It was irrigated.  Dilute epinephrine soaked gauze were then placed for hemostasis.    Plan for wound VAC  placement tomorrow.    Electronically signed by Sula Soda, MD on 11/10/2022 at 2:31 PM

## 2022-11-10 NOTE — Progress Notes (Signed)
[facility] Hospitalist Service     Hospitalist Progress Note     PCP: Leafy Ro, MD   Admission Date: 11/06/2022 11:19 AM    Admitted from: ICU     Subjective     Patient seen at this time. She is tracking with eyes and following commands. Still with very significant neurologic deficits. Grandaughter is in the room, expresses that she wants to ensure her grandmother is not in pain. I asked the patient directly if she is in pain and she said "no" however grandaughter says she screams when she is turned. We may need to premedicate her prior to dressing changes. Patient has had tube feeds held in anticipation for surgery later today.       Physical Exam   Vitals: BP (!) 166/67   Pulse 77   Temp 97.1 F (36.2 C) (Axillary)   Resp 20   Ht 1.73 m (5' 8.11")   Wt 100.4 kg (221 lb 5.5 oz)   SpO2 97%   BMI 33.55 kg/m Temp (24hrs), Avg:97.6 F (36.4 C), Min:97.1 F (36.2 C), Max:98.2 F (36.8 C)      Constitutional: see vital signs above. Acutely ill appearing female. Conservation officer, nature but non-verbal. Follows commands  Eyes: conjunctivae and lids - normal. Pupils and irises - PEERL, normal.  Respiratory: respiratory effort - normal. Lungs CTA.  Cardiovascular: regular rate, regular rhythm, no murmurs. No clubbing or cyanosis. No edema. No varicosities.  Gastrointestinal: Normal bowel sounds. No tenderness. No hepatomegaly or splenomegaly. NGT in place  Skin: No rashes or other lesions. No induration or subcutaneous masses  Neuro: Alert, dense right hemiplegia, expressive aphasia, right facial droop       Labs and Imaging   Data: I have personally reviewed all lab results and independently reviewed imaging studies performed in the past 24 hours.    Hematologic/Coags Chemistries   Recent Labs     11/08/22  0346 11/09/22  0509 11/10/22  0458 11/10/22  0642   WBC 10.5 10.5 12.9* 13.9*   HGB 9.7* 9.9* 10.5* 9.9*   HCT 31.8* 33.0* 33.5* 32.1*   PLT 200 210 191 202     Lab Results   Component Value Date/Time    PROT 5.2  11/10/2022 04:58 AM     No results found for: "INR", "PROTIME"  No results found for: "APTT"   Recent Labs     11/08/22  0346 11/08/22  1508 11/09/22  0509 11/09/22  1913 11/10/22  0458 11/10/22  0642   NA 152*   < > 152*  152* 149* 148* 149*   K 3.5   < > 3.7  3.7 3.6 3.6 3.9   CL 119*   < > 118*  118* 115* 115* 115*   CO2 23   < > '22  22 23 23 22   '$ BUN 150*   < > 118*  118* 92* 85* 87*   CREATININE 2.5*   < > 2.0*  2.0* 1.7* 1.5* 1.6*   BILITOT 0.35  --  0.36  --  0.44  --    ALKPHOS 63  --  65  --  66  --    AST 42*  --  32  --  24  --    ALT 39*  --  34  --  28  --     < > = values in this interval not displayed.     No results for input(s): "GLU" in the last 72 hours.  No results found for: "CPK", "CKMB", "TROPONINI"  No results found for: "IRON", "FERRITIN"     Inflammatory/Respiratory Diabetes   No results found for: "CRP"  No results found for: "ESR"  ABGs:  Lab Results   Component Value Date/Time    PHART 7.337 11/06/2022 05:34 PM    PO2ART 322.0 11/06/2022 05:34 PM    PCO2ART 38.4 11/06/2022 05:34 PM      Lab Results   Component Value Date/Time    CREATININE 1.6 11/10/2022 06:42 AM                Scheduled Meds:    piperacillin-tazobactam  3,375 mg IntraVENous Q8H    aspirin  81 mg Per NG tube Daily    sodium chloride flush  5-40 mL IntraVENous 2 times per day    heparin (porcine)  5,000 Units SubCUTAneous 3 times per day     Continuous Infusions:    sodium chloride         Assessment and Plan:  Principal Problem:    Altered mental status  Active Problems:    AKI (acute kidney injury) ([place])    Dehydration    Palliative care by specialist    Acute embolic stroke ([place])    Hemiplegia and hemiparesis following cerebral infarction affecting right dominant side ([place])  Resolved Problems:    * No resolved hospital problems. *      Plan by Problem  CVA with Right facial droop, right hemiplegia, Dysphagia   - Known left carotid stenosis  - MRI, MRA head and neck showed no LVO. imaging shows several areas of  stroke  - Continue aspirin  - Echo 3/8: EF 65-70%  - Neuro following  - Speech therapy, PT, OT, PPD     Metabolic encephalopathy  -May have been down as long as 6 days  - Unclear if fall was caused by a stroke or if patient may subsequently had a stroke while lying on the ground     Hypernatremia  - Na 149, FWF had been increased     Aspiration pneumonia with severe sepsis +/- hypovolemic shock  -Continue broad-spectrum antibiotics Zosyn and vancomycin.    -Blood cultures remain negative  - No longer requiring pressor therapy  - Needs follow-up chest imaging as radiology reported they could not exclude underlying nodule     AKI  - Admitted with acute kidney injury.    -Continue IV fluids  - Creat is downtrending     Sacral wound with eschar, present on admission  SEPSIS present on admission  Polymicrobial bacteremia (Globicatella sanguinis, Aerococcus viridans, proteus mirabilis)  - approximately 13 x10 cm eschar in sacral area with fluctuance  - Due to prolonged downtime  - Wound care following  - General surgery consulted and plan for debridement in OR on 3/11  - Continue Vanc/Zosyn  -ID consulted for antimicrobial gudance     Elevated troponin, aortic valve stenosis  - Likely stress-induced ischemia  - Echo: Mod Aortic Valve stenosis. AVA 1.6 cm2, peak AV grad 23, severe mitral annular calcification  -Family asks about anesthesia risk. I stated her risks of GA would be higher given her comorbidities but acceptable if surgery necessary    Rhabdomyolysis  - patient found down at home, likely down for approx 6 days according to family  - CPK peaked at 28 and is down trending  - lactic acidosis resolved     Chronic compression fracture  - Patient with chronic back pain and some debility  -  PT, OT     Multiple medical comorbidities  -DO NOT RESUSCITATE CODE STATUS  -Followed by palliative care       Prophylaxis/Bundle    Dispo: appropriate for floor transfer  Medical Decision maker: Extended Emergency Contact  Information  Primary Emergency Contact: son Rudean Curt.   Mobile Phone: [phone]  Secondary Emergency Contact: [place] Phone: [phone]  PCP: Leafy Ro, MD      Disposition    EDD:  Stable for transfer out of the ICU, but remains ill, requiring IV antibiotics, surgical procedures, continues inpatient hospitalization    Bobbye Charleston, MD  11/10/2022 10:12 AM  [facility] Hospitalist Service

## 2022-11-10 NOTE — Anesthesia Procedure Notes (Signed)
Airway  Date/Time: 11/10/2022 1:40 PM  Urgency: elective    Airway not difficult    General Information and Staff    Patient location during procedure: OR  Performed by: Isaiah Blakes, AA  Authorized by: Hollice Espy, MD      Indications and Patient Condition  Indications for airway management: anesthesia  Sedation level: deep  Preoxygenated: yes  Patient position: sniffing  Mask difficulty assessment: vent by bag mask    Final Airway Details  Final airway type: endotracheal airway      Successful airway: ETT  Cuffed: yes   Successful intubation technique: direct laryngoscopy  Endotracheal tube insertion site: oral  Blade: Macintosh  Blade size: #3  ETT size (mm): 7.5  Cormack-Lehane Classification: grade IIa - partial view of glottis  Placement verified by: capnometry   Inital cuff pressure (cm H2O): 8  Measured from: lips  ETT to lips (cm): 22  Number of attempts at approach: 1  Ventilation between attempts: bag mask    Additional Comments  ASA monitors applied. Patient preoxygenated. IV induction. Eyes taped. Mod RSI. Direct laryngoscopy, 1 attempt. Easy, atraumatic intubation with oral ETT. Positive ETCO2 detected, ETT secured. Patient positioned, head & neck supported. All pressure points padded.  no

## 2022-11-10 NOTE — Progress Notes (Signed)
S: Transferred to floor. Facial droop and right hemiplegia remain. Able to communicate somewhat. Granddaughter at bedside, she and the patient tell me that her son Waunita Schooner will be the consenting family member for procedures. They wish to proceed with surgery today.    O: Physical Exam   Vitals:    11/09/22 2136 11/10/22 0053 11/10/22 0445 11/10/22 0744   BP:  (!) 166/66 (!) 177/72 (!) 166/67   Pulse:  73 80 77   Resp: '18 20 20 20   '$ Temp:  97.6 F (36.4 C) 97.4 F (36.3 C) 97.1 F (36.2 C)   TempSrc:  Oral Axillary Axillary   SpO2:  98% 96% 97%   Weight:       Height:          CBC:  Recent Labs     11/09/22  0509 11/10/22  0458 11/10/22  0642   WBC 10.5 12.9* 13.9*   RBC 3.46* 3.67 3.46*   HGB 9.9* 10.5* 9.9*   HCT 33.0* 33.5* 32.1*   MCV 95.4 91.3 92.8   RDW 14.9 14.6 14.6   PLT 210 191 202     CHEMISTRIES:  Recent Labs     11/07/22  1942 11/08/22  0346 11/08/22  1508 11/09/22  1913 11/10/22  0458 11/10/22  0642   NA 149* 152*   < > 149* 148* 149*   K 3.3* 3.5   < > 3.6 3.6 3.9   CL 115* 119*   < > 115* 115* 115*   CO2 22 23   < > '23 23 22   '$ BUN 154* 150*   < > 92* 85* 87*   CREATININE 2.4* 2.5*   < > 1.7* 1.5* 1.6*   GLUCOSE 152* 145*   < > 128* 122* 125*   PHOS  --  4.2  --   --  2.6  --    MG 2.8* 2.7*  --   --  2.2  --     < > = values in this interval not displayed.     PT/INR:No results for input(s): "PROTIME", "INR" in the last 72 hours.  APTT:No results for input(s): "APTT" in the last 72 hours.  LIVER PROFILE:  Recent Labs     11/08/22  0346 11/09/22  0509 11/10/22  0458   AST 42* 32 24   ALT 39* 34 28   BILIDIR  --   --  0.20   BILITOT 0.35 0.36 0.44   ALKPHOS 63 65 66     I/O last 3 completed shifts:  In: 5121.1 [I.V.:1081.1; NG/GT:3640; IV [id number]  Out: 2775 [Urine:2775]  No intake/output data recorded.    Lungs: non-labored respiration.  Heart: normal rate  Abdomen: soft, non-tender, non-distended  Mental Status: alert, able to answer questions     A/P:  Septic shock present on admission due to  sacral decubitus ulcer, rhabdo, aspiration pneumonia  Bacteremia  -Met sepsis criteria on admission due to hypothermia, leukocytosis, lactic acidosis, and hypotension requiring pressor support.  -Labs reviewed. WBC 13,000. She is afebrile and non tachycardic.  -Blood cultures positive for globicatella sanguinis, aerococcus viridans, proteus mirabilis. ID now following and guiding abx.  -To the OR today for debridement/I&D of sacral wound with general anesthesia. Granddaughter and patient consent to surgery and all questions answered. Patient's son, Waunita Schooner, consenting family member.  -[place] for nutrition--tube feeds were held at midnight.     ICU managing:   #Metabolic encephalopathy  #Dysphagia  #CVA  #Aspiration  pneumonia  #AKI  #Rhabdo  #acute hypoxemic resp failure        Bonnell Public, PA-C  General Surgery  11/10/22

## 2022-11-10 NOTE — Care Coordination-Inpatient (Signed)
11/10/2022 AMB  Discussed pt in AM rounds. Pt will go to OR today for debridement with General Surgery.    11/07/22 SG:  Palliative care eval completed. PT made DNR but remains treatment focused. CM will follow for updates as workup progresses. -SG.     11/06/22  [facility]:  Pt unable to participate in assessment at this time.  Chart screening completed. Pt resides [facility] at [facility].  She ambulates with a walker.  She was found down by the apartment manager after family could not reach her for several days.  Pt's PCP is Dr. Tamala Julian.  Pt no-showed for her PCP appointment on 10/16/22.  Per H&P, pt's son is en route from [place].  Pt's daughter lives in [place].  Pt has used [facility] in the past. Consults placed for Neurology, Wound Care, and PT/OT/SLP.  PPD has been ordered in case SNF is needed.  Disposition pending clinical course.  CM will continue to follow.

## 2022-11-10 NOTE — Progress Notes (Addendum)
Nutrition Note - TF Check In     Feeds currently held for debridement today.  Family declined any issues w/ tolerating TF.  Lytes WNL.  Na improving w/ increased FWF yesterday - pt on zosyn.  Will continue FWF for now since feeds have been held all day today.  Can adjust PRN / will FU tomorrow. LBM today.  PRN bowel meds ordered.  DNR code.   Advance to goal rate s/p procedure.      Nutrition Recommendations/Plan:   NPO  Enteral Nutrition:   Start Rate: Vital AF 1.2 via NEFT at 43m/hr with 1546mhr FWF for hypernatremia - now w/ 600 mL FWD (improved from yesterday)   Advance 1025m4 to Goal Rate: Vital AF 1.2 at 14m66m with 20ml64mFWF+Juven BID.  This formula is to provide 1764kcal (99%EEN), 103g pro (97%EPN), 147g carb, and 1069ml 39mater.  Adjust fluid as needed.   3. Rec MVI, Thiamine in the setting of malnutrition. Pt at risk for refeeding, replace electrolytes as needed.    Estimated Daily Nutrient Needs:  Energy Requirements Based On: Formula  Weight Used for Energy Requirements: Current  Energy (kcal/day): 1789kcal (MSJX1.3)  Weight Used for Protein Requirements: Current  Protein (g/day): 106-132g (1.2-1.5g/kg)  CHO: 224g (50%EEN)  Method Used for Fluid Requirements: 1 ml/kcal  Fluid (ml/day): 1789ml (52mkca108mor per MD.    Lab Results   Component Value Date/Time    NA 149 11/10/2022 06:42 AM    K 3.9 11/10/2022 06:42 AM    CL 115 11/10/2022 06:42 AM    CO2 22 11/10/2022 06:42 AM    BUN 87 11/10/2022 06:42 AM    CREATININE 1.6 11/10/2022 06:42 AM    GLUCOSE 125 11/10/2022 06:42 AM    CALCIUM 8.2 11/10/2022 06:42 AM    PHOS 2.6 11/10/2022 04:58 AM    MG 2.2 11/10/2022 04:58 AM     Lab Results   Component Value Date/Time    TRIG 152 11/10/2022 06:42 AM    TRIG 129 02/03/2022 12:12 PM     Lab Results   Component Value Date/Time    POCGLU 137.0 11/08/2022 12:21 PM    POCGLU 123.0 11/06/2022 05:18 PM    POCGLU 129.0 11/06/2022 04:44 PM     Scheduled Meds:   piperacillin-tazobactam  3,375 mg IntraVENous  Q8H    aspirin  81 mg Per NG tube Daily    sodium chloride flush  5-40 mL IntraVENous 2 times per day    heparin (porcine)  5,000 Units SubCUTAneous 3 times per day     Continuous Infusions:   sodium chloride       PRN Meds:.traMADol, sodium chloride flush, sodium chloride, ondansetron **OR** ondansetron, bisacodyl, senna, aluminum & magnesium hydroxide-simethicone, acetaminophen **OR** acetaminophen  Past Medical History:   Diagnosis Date    Hx of spinal cord injury     Leaking of urine        Electronically signed by Dorsie Sethi AWelton Flakes3/11/24 at 11:52 AM EDT    Contact: Thank you for allowing me to participate in the care of this patient.   Please contact your Registered Dietitian with any questions or concerns.     [place]: [phone]2-[place]: [phone] your clinical nutrition team via [facility]

## 2022-11-11 ENCOUNTER — Inpatient Hospital Stay: Admit: 2022-11-11 | Payer: PRIVATE HEALTH INSURANCE | Primary: Internal Medicine

## 2022-11-11 LAB — CULTURE, TISSUE: Gram Stain Result: NONE SEEN — AB

## 2022-11-11 LAB — PHOSPHORUS: Phosphorus: 3 mg/dL (ref 2.5–4.5)

## 2022-11-11 LAB — MAGNESIUM: Magnesium: 2.3 mg/dL (ref 1.6–2.6)

## 2022-11-11 LAB — CBC
Hematocrit: 27.1 % — ABNORMAL LOW (ref 34.0–47.0)
Hemoglobin: 8.4 g/dL — ABNORMAL LOW (ref 11.5–15.7)
MCH: 29.1 pg (ref 27.0–34.5)
MCHC: 31 g/dL — ABNORMAL LOW (ref 32.0–36.0)
MCV: 93.8 fL (ref 81.0–99.0)
MPV: 11.3 fL (ref 7.2–13.2)
NRBC Absolute: 0.03 10*3/uL — ABNORMAL HIGH (ref 0.000–0.012)
NRBC Automated: 0.2 % (ref 0.0–0.2)
Platelets: 177 10*3/uL (ref 140–440)
RBC: 2.89 x10e6/mcL — ABNORMAL LOW (ref 3.60–5.20)
RDW: 14.6 % (ref 11.0–16.0)
WBC: 15.1 10*3/uL — ABNORMAL HIGH (ref 3.8–10.6)

## 2022-11-11 LAB — BASIC METABOLIC PANEL
Anion Gap: 9 mmol/L (ref 2–17)
BUN: 84 mg/dL — ABNORMAL HIGH (ref 8–23)
CO2: 25 mmol/L (ref 22–29)
Calcium: 8.3 mg/dL — ABNORMAL LOW (ref 8.8–10.2)
Chloride: 115 mmol/L — ABNORMAL HIGH (ref 98–107)
Creatinine: 1.6 mg/dL — ABNORMAL HIGH (ref 0.5–1.0)
Est, Glom Filt Rate: 31 mL/min/1.73m — ABNORMAL LOW (ref >=60)
Glucose: 175 mg/dL — ABNORMAL HIGH (ref 70–99)
OSMOLALITY CALCULATED: 325 mOsm/kg — ABNORMAL HIGH (ref 270–287)
Potassium: 3.9 mmol/L (ref 3.5–5.3)
Sodium: 149 mmol/L — ABNORMAL HIGH (ref 135–145)

## 2022-11-11 LAB — POCT GLUCOSE
POC Glucose: 162 mg/dL — ABNORMAL HIGH (ref 65.0–110.0)
POC Glucose: 217 mg/dL — ABNORMAL HIGH (ref 65.0–110.0)

## 2022-11-11 MED ORDER — MORPHINE SULFATE 4 MG/ML IV SOLN
4 MG/ML | INTRAVENOUS | Status: AC | PRN
Start: 2022-11-11 — End: 2022-11-12
  Administered 2022-11-11 – 2022-11-12 (×5): 4 mg via INTRAVENOUS

## 2022-11-11 MED ORDER — LORAZEPAM 2 MG/ML IJ SOLN
2 MG/ML | INTRAMUSCULAR | Status: AC | PRN
Start: 2022-11-11 — End: 2022-11-12

## 2022-11-11 MED ORDER — ONDANSETRON HCL 4 MG/2ML IJ SOLN
42 MG/2ML | INTRAMUSCULAR | Status: AC | PRN
Start: 2022-11-11 — End: 2022-11-12

## 2022-11-11 MED ORDER — ATORVASTATIN CALCIUM 40 MG PO TABS
40 | Freq: Every evening | ORAL | Status: DC
Start: 2022-11-11 — End: 2022-11-11

## 2022-11-11 MED ORDER — HYDROMORPHONE HCL 2 MG PO TABS
2 | Freq: Once | ORAL | Status: AC | PRN
Start: 2022-11-11 — End: 2022-11-10
  Administered 2022-11-11: 01:00:00 1 mg via ORAL

## 2022-11-11 MED ORDER — ACETAMINOPHEN 325 MG PO TABS
325 | Freq: Four times a day (QID) | ORAL | Status: DC | PRN
Start: 2022-11-11 — End: 2022-11-12

## 2022-11-11 MED ORDER — ACETAMINOPHEN 650 MG RE SUPP
650 | Freq: Four times a day (QID) | RECTAL | Status: DC | PRN
Start: 2022-11-11 — End: 2022-11-12

## 2022-11-11 MED ORDER — ALUM & MAG HYDROXIDE-SIMETH 200-200-20 MG/5ML PO SUSP
200-200-205 MG/5ML | Freq: Four times a day (QID) | ORAL | Status: AC | PRN
Start: 2022-11-11 — End: 2022-11-12

## 2022-11-11 MED ORDER — OXYCODONE HCL 5 MG PO TABS
5 MG | ORAL | Status: AC | PRN
Start: 2022-11-11 — End: 2022-11-12
  Administered 2022-11-11: 18:00:00 2.5 mg via NASOGASTRIC

## 2022-11-11 MED ORDER — DEXTROSE 5 % IV SOLN
5 | INTRAVENOUS | Status: DC
Start: 2022-11-11 — End: 2022-11-11
  Administered 2022-11-11: 15:00:00 via INTRAVENOUS

## 2022-11-11 MED ORDER — GLYCOPYRROLATE 0.2 MG/ML IJ SOLN
0.2 MG/ML | INTRAMUSCULAR | Status: AC | PRN
Start: 2022-11-11 — End: 2022-11-12
  Administered 2022-11-11: 21:00:00 0.2 mg via INTRAVENOUS

## 2022-11-11 MED ORDER — ONDANSETRON 4 MG PO TBDP
4 MG | Freq: Four times a day (QID) | ORAL | Status: AC | PRN
Start: 2022-11-11 — End: 2022-11-12

## 2022-11-11 MED ORDER — MORPHINE SULFATE 4 MG/ML IV SOLN
4 | INTRAVENOUS | Status: DC | PRN
Start: 2022-11-11 — End: 2022-11-12
  Administered 2022-11-11: 20:00:00 2 mg via INTRAVENOUS

## 2022-11-11 MED ORDER — SENNOSIDES 8.6 MG PO TABS
8.6 MG | Freq: Every day | ORAL | Status: AC | PRN
Start: 2022-11-11 — End: 2022-11-12

## 2022-11-11 MED ORDER — TRAMADOL HCL 50 MG PO TABS
50 | Freq: Four times a day (QID) | ORAL | Status: DC | PRN
Start: 2022-11-11 — End: 2022-11-12

## 2022-11-11 MED ORDER — SCOPOLAMINE 1 MG/3DAYS TD PT72
13 MG/3DAYS | TRANSDERMAL | Status: AC
Start: 2022-11-11 — End: 2022-11-12
  Administered 2022-11-11: 21:00:00 1 via TRANSDERMAL

## 2022-11-11 MED FILL — PIPERACILLIN SOD-TAZOBACTAM SO 3.375 (3-0.375) G IV SOLR: 3.375 (3-0.375) g | INTRAVENOUS | Qty: 3375

## 2022-11-11 MED FILL — NORMAL SALINE FLUSH 0.9 % IV SOLN: 0.9 % | INTRAVENOUS | Qty: 10

## 2022-11-11 MED FILL — HEPARIN SODIUM (PORCINE) 5000 UNIT/ML IJ SOLN: 5000 UNIT/ML | INTRAMUSCULAR | Qty: 1

## 2022-11-11 MED FILL — HYDROMORPHONE HCL 2 MG PO TABS: 2 MG | ORAL | Qty: 1

## 2022-11-11 MED FILL — NORMAL SALINE FLUSH 0.9 % IV SOLN: 0.9 % | INTRAVENOUS | Qty: 30

## 2022-11-11 MED FILL — TRAMADOL HCL 50 MG PO TABS: 50 MG | ORAL | Qty: 1

## 2022-11-11 MED FILL — ATORVASTATIN CALCIUM 40 MG PO TABS: 40 MG | ORAL | Qty: 1

## 2022-11-11 MED FILL — SCOPOLAMINE 1 MG/3DAYS TD PT72: 1 MG/3DAYS | TRANSDERMAL | Qty: 1

## 2022-11-11 MED FILL — MORPHINE SULFATE (PF) 4 MG/ML IJ SOLN: 4 mg/mL | INTRAMUSCULAR | Qty: 1

## 2022-11-11 MED FILL — OXYCODONE HCL 5 MG PO TABS: 5 MG | ORAL | Qty: 1

## 2022-11-11 MED FILL — GLYCOPYRROLATE 0.2 MG/ML IJ SOLN: 0.2 MG/ML | INTRAMUSCULAR | Qty: 1

## 2022-11-11 NOTE — Progress Notes (Signed)
Palliative Medicine Progress Note        SUBJECTIVE     Met with patient and granddaughter at bedside. I re-introduced them to palliative care and the support we provide. Granddaughter voices frustration in trying to get HCPOA paperwork done. Patient is very sleepy during my encounter and does not fully answer my questions-family reports just given a dose or toradol. Granddaughter makes it clear that she wants her to be comfortable while we continue treating reversible conditions. She voices frustration within the family and concern of patients well being. I explained the HCPOA paperwork and need for the patient to be fully awake/oriented. She states that she's been giving her own consents for surgery and is alert(per exam review she has been non-verbal). I explained that this paperwork will only be in affect when she can no longer make her own decisions and explained the state of [place] law in the absence of paperwork goes to her children who are readily available and acting in her best interest. Granddaughter is tearful and frustrated at the situation. I provided a listening ear and answered her questions. I let our palliative care chaplain Alex aware of the concerns and need for emotional support as well.      Debrief with charge RN [place] and chaplain Cat throughout the day.     Came back at bedside to discuss further with granddaughter [place]. We spoke outside the room per request and continued goals of care discussion. I shared what a transition to hospice/comfort care would look like. Her mother was on the phone and agreed with plan while deferring to [place]. Call placed to Rudean Curt at [phone] and voicemail left. Call placed to Cloretta Ned at [phone] and provided her an update on patients condition/answered questions. Shared the option of comfort care/transition to hospice that [place] and Anderson [place] are in agreement with. She is also in agreement and answered her questions. She has experience as a  Insurance underwriter and plans to get here as soon as feasible. Shanon Brow called back and provided him with the same update/option of hospice care. He is also in agreement and would like her evaluated for the hospice house. He is going through cancer treatments and is very familiar with palliative care for himself. Came to bedside where Anderson [place] was present and continued comfort care discussions. She is having a hard time with the idea of stopping tube feeds but after extensive discussion voices understanding of comfort approach and agreement with stopping.     Discussed with RN Joy. Discussed with Dr. Atilano Ina and case management.     Comfort orders placed. Referral to hospice being done by case management.    REVIEW OF SYSTEMS     Review of Systems   Unable to perform ROS: Patient nonverbal         Vitals:    BP 126/62   Pulse 91   Temp 98.4 F (36.9 C) (Axillary)   Resp 16   Ht 1.73 m (5' 8.11")   Wt 100.4 kg (221 lb 5.5 oz)   SpO2 94%   BMI 33.55 kg/m       OBJECTIVE     Physical Exam  Vitals and nursing note reviewed.   Constitutional:       Appearance: She is ill-appearing.   HENT:      Head: Normocephalic and atraumatic.      Mouth/Throat:      Mouth: Mucous membranes are dry.   Pulmonary:      Effort: Pulmonary  effort is normal.   Musculoskeletal:      Cervical back: Neck supple.   Skin:     Capillary Refill: Capillary refill takes 2 to 3 seconds.      Coloration: Skin is pale.      Findings: Bruising present.   Neurological:      Mental Status: She is easily aroused. She is lethargic and disoriented.         DATA:      Imaging:  XR ABDOMEN (KUB) (SINGLE AP VIEW)    Result Date: 11/11/2022  Abdomen AP view: 11/11/22 INDICATION: "Confirm placement of NG tube". COMPARISON: 11/06/2022 FINDINGS: Prominent mitral calcifications. No supine evidence of pneumoperitoneum. Bowel gas pattern is nonobstructive. Enteric tube tip is within the third portion of the duodenum. No abnormal calcification. No focal bony  abnormality.     Enteric tube tip located transverse duodenum.    XR CHEST PORTABLE    Result Date: 11/10/2022  Chest AP: 11/10/22 INDICATION: Confirm dobhoff placement L08.9,Local infection of the skin and subcutaneous tissue, unspecified,ICD-10-CM COMPARISON: 11/06/2022 FINDINGS: No pneumothorax or pleural fluid. No edema or focal opacity. Cardiomediastinal contours are normal for AP technique. No acute bony abnormality. Central catheter tip overlies cavoatrial junction. An enteric tube is present, the tip is below the field-of-view, but beyond the mid second portion of duodenum.     An enteric tube is present, the tip is below the field-of-view, but beyond the mid second portion of duodenum. If more detailed localization of the tip is needed then an abdominal radiograph could be obtained.       CBC:   Recent Labs     11/10/22  0458 11/10/22  0642 11/11/22  0619   WBC 12.9* 13.9* 15.1*   HGB 10.5* 9.9* 8.4*   PLT 191 202 177     Last 3 CMP:   Recent Labs     11/09/22  0509 11/09/22  1913 11/10/22  0458 11/10/22  0642 11/11/22  0619   NA 152*  152*   < > 148* 149* 149*   K 3.7  3.7   < > 3.6 3.9 3.9   CL 118*  118*   < > 115* 115* 115*   CO2 22  22   < > '23 22 25   '$ BUN 118*  118*   < > 85* 87* 84*   CREATININE 2.0*  2.0*   < > 1.5* 1.6* 1.6*   GLUCOSE 149*  149*   < > 122* 125* 175*   CALCIUM 8.0*  8.0*   < > 8.2* 8.2* 8.3*   PROT 4.8*  --  5.2*  --   --    LABALBU 2.2*  --  2.3*  --   --    BILITOT 0.36  --  0.44  --   --    ALKPHOS 65  --  66  --   --    AST 32  --  24  --   --    ALT 34  --  28  --   --     < > = values in this interval not displayed.          ASSESSMENT/PLAN     1. Stenosis of left carotid artery    2. Altered mental status, unspecified altered mental status type    3. Pneumonia of both lungs due to infectious organism, unspecified part of lung    4. AKI (acute kidney injury) ([place])  5. Dehydration    6. Elevated troponin    7. Traumatic blister of back    8. Privation as cause of  accidental injury, initial encounter    9. Other place in apartment as the place of occurrence of the external cause    10. Infected abrasion of buttock, initial encounter           1. Goals of Care:  Patient not able to make her own medical decisions at this time or participate in conversation. Discussed with all 3 of her children and granddaughter [place]. Everyone is in agreement to transition to comfort care/hospice. They would like her evaluated for transfer to the hospice house.     2. Symptom Management:  Comfort care order set.     3. Disease Management:  Per primary team    4. Psychosocial Support:  Appreciate chaplain support    5. Code Status:  DNR     Thank you for the opportunity to care for this patient/family.    Total time:  80 minutes. Time spent includes pt/family discussion re condition, treatment and goals of care.         This note was created using voice recognition software and may contain typographic errors missed during final review. The intent is to have a complete and accurate medical record.   As a valued partner in this safety effort, if you have noted factual errors, please complete the Health Information Amendment/Correct Form or call the [place] Management Office at [phone].

## 2022-11-11 NOTE — Progress Notes (Signed)
Physical Therapy Attempt Note    Pt to OR for sacral wound debridement 11/10/22. Pt will need new PT orders post OR surgical procedure. Please re-order when appropriate. Will place pt on medical hold until new orders received.     [place]

## 2022-11-11 NOTE — Progress Notes (Signed)
Attempted visit by this Chaplain with Palliative Care to offer spiritual-emotional support and assess needs per referral from Palliative Care's Campbell Stall NP and per Epic consult.     No family/loved ones at the bedside at time of visit. Ms. Zelle did not respond to this Chaplain's voice.     Will continue to follow. In the meantime, Chaplains remain available to provide spiritual-emotional support. Please consult [place] via [employer] as needs arise. Thank you.     [place], MDiv/MPP, [facility]      11/11/22 1413   Encounter Summary   Last Encounter  11/11/22

## 2022-11-11 NOTE — Progress Notes (Signed)
S: Wound vac placed by wound care team without issue today, spoke with daughter at bedside     O: Physical Exam   Vitals:    11/10/22 2333 11/11/22 0410 11/11/22 0811 11/11/22 0846   BP: 119/67 (!) 113/54 126/62    Pulse: 91 94 91    Resp: '16 16 16 16   '$ Temp: 97.9 F (36.6 C) 98.1 F (36.7 C) 98.4 F (36.9 C)    TempSrc: Axillary Axillary Axillary    SpO2: 95% 94% 94%    Weight:       Height:          CBC:  Recent Labs     11/10/22  0458 11/10/22  0642 11/11/22  0619   WBC 12.9* 13.9* 15.1*   RBC 3.67 3.46* 2.89*   HGB 10.5* 9.9* 8.4*   HCT 33.5* 32.1* 27.1*   MCV 91.3 92.8 93.8   RDW 14.6 14.6 14.6   PLT 191 202 177       CHEMISTRIES:  Recent Labs     11/10/22  0458 11/10/22  0642 11/11/22  0619   NA 148* 149* 149*   K 3.6 3.9 3.9   CL 115* 115* 115*   CO2 '23 22 25   '$ BUN 85* 87* 84*   CREATININE 1.5* 1.6* 1.6*   GLUCOSE 122* 125* 175*   PHOS 2.6  --  3.0   MG 2.2  --  2.3       PT/INR:No results for input(s): "PROTIME", "INR" in the last 72 hours.  APTT:No results for input(s): "APTT" in the last 72 hours.  LIVER PROFILE:  Recent Labs     11/09/22  0509 11/10/22  0458   AST 32 24   ALT 34 28   BILIDIR  --  0.20   BILITOT 0.36 0.44   ALKPHOS 65 66       I/O last 3 completed shifts:  In: 1546.1 [I.V.:281.1; NG/GT:1065; IV Piggyback:200]  Out: 2500 [Urine:2500]  I/O this shift:  In: 0   Out: 600 [Urine:450; Emesis/NG output:150]    Lungs: non-labored respiration.  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 Afebrile and non tachycardic  -Blood cultures positive for globicatella sanguinis, aerococcus viridans,  proteus mirabilis. ID now following and guiding abx.  -Dobhoff for nutrition--tube feeds restarted   -wound vac was placed by wound care team this morning with good seal, family working on goals of care and palliative discussions   -Goal of VAC therapy is comfort, and prevention of multiple dressing changes daily. WOC to change VAC dressing weekly   Should family choose an aggressive path for patient, WOC will need to change 2-3 times weekly.    -General surgery will sign off please call with any questions or concerns, wound care team managing vac changes      ICU managing:   #Metabolic encephalopathy  #Dysphagia  #CVA  #Aspiration pneumonia  #AKI  #Rhabdo  #acute hypoxemic resp failure

## 2022-11-11 NOTE — Progress Notes (Signed)
Hospitalist Daily Progress Note    Subjective/interval events:   Discussed patient's hospitalization and prognosis with granddaughter at bedside.  She is realistic about patient's poor prognosis.  Ms. Defelice is able to follow some commands (can squeeze with her left hand), otherwise is unable to communicate.  She has severe dysphagia from her stroke unable to tolerate any p.o. intake.  Granddaughter feels that patient would want to be made comfortable in the situation.  Palliative care was consulted and able to discuss with all 3 of the patient's children who all agreed that a comfort care approach was in the patient's best interest and what the patient would have wanted.  She is now for comfort care.  She will be evaluated for inpatient hospice.    Medications:   Scheduled Meds:   atorvastatin  40 mg Per NG tube Nightly    piperacillin-tazobactam  3,375 mg IntraVENous Q8H    aspirin  81 mg Per NG tube Daily    sodium chloride flush  5-40 mL IntraVENous 2 times per day    heparin (porcine)  5,000 Units SubCUTAneous 3 times per day     Continuous Infusions:   dextrose 100 mL/hr at 11/11/22 1044    sodium chloride       PRN Meds:traMADol, senna, ondansetron **OR** ondansetron, aluminum & magnesium hydroxide-simethicone, acetaminophen **OR** acetaminophen, oxyCODONE, morphine **OR** morphine, glycopyrrolate, LORazepam, sodium chloride flush, sodium chloride, bisacodyl      Objective:      Physical Exam:  General: Ill appearing elderly white female  HEENT: R facial droop  Cardiac: regular rate and rhythm, no clicks, rubs, or murmurs   Pulmonary: clear to auscultation bilaterally, no respiratory distress  GI: soft, nondistended  MSK/Skin: Large sacral wound, bandage in place cdi  Neuro:tracks intermittently with eyes and follows some commands: squeezes with left hand when asked, otherwise unable to communicate.Right hemiplegia    Cardiographics  11/06/22    ECHO (TTE) COMPLETE (PRN CONTRAST/BUBBLE/STRAIN/3D) 11/07/2022   3:19 PM (Final)    Interpretation Summary    Left Ventricle: Normal left ventricular systolic function with a visually estimated EF of 65 - 70%. Left ventricle size is normal. Mildly increased wall thickness. Moderate basal septal thickening. Normal diastolic function.    Right Ventricle: Right ventricle size is normal. Normal systolic function.    Aortic Valve: Mild sclerotic changes Moderate stenosis of the aortic valve. AV mean gradient is 23 mmHg. AV peak velocity is 3.1 m/s. AV area by continuity VTI is 1.6 cm2.    Mitral Valve: Severe annular calcification of the mitral valve.  Leaflets appear mobile, mild fibrocalcific changes.    Left Atrium: Left atrium is mildly dilated.    Aorta: Normal sized aortic root and ascending aorta.    Pericardium: No pericardial effusion.    Image quality is adequate. Technically difficult study.    Signed by: Jerrell Belfast, MD on 11/07/2022  3:19 PM      Imaging  XR ABDOMEN (KUB) (SINGLE AP VIEW)    Result Date: 11/11/2022  Abdomen AP view: 11/11/22 INDICATION: "Confirm placement of NG tube". COMPARISON: 11/06/2022 FINDINGS: Prominent mitral calcifications. No supine evidence of pneumoperitoneum. Bowel gas pattern is nonobstructive. Enteric tube tip is within the third portion of the duodenum. No abnormal calcification. No focal bony abnormality.     Enteric tube tip located transverse duodenum.    XR CHEST PORTABLE    Result Date: 11/10/2022  Chest AP: 11/10/22 INDICATION: Confirm dobhoff placement L08.9,Local infection of the skin and  subcutaneous tissue, unspecified,ICD-10-CM COMPARISON: 11/06/2022 FINDINGS: No pneumothorax or pleural fluid. No edema or focal opacity. Cardiomediastinal contours are normal for AP technique. No acute bony abnormality. Central catheter tip overlies cavoatrial junction. An enteric tube is present, the tip is below the field-of-view, but beyond the mid second portion of duodenum.     An enteric tube is present, the tip is below the field-of-view, but  beyond the mid second portion of duodenum. If more detailed localization of the tip is needed then an abdominal radiograph could be obtained.      Lab Review   Lab Results   Component Value Date    WBC 15.1 (H) 11/11/2022    HGB 8.4 (L) 11/11/2022    HCT 27.1 (L) 11/11/2022    MCV 93.8 11/11/2022    PLT 177 11/11/2022     Lab Results   Component Value Date/Time    NA 149 11/11/2022 06:19 AM    K 3.9 11/11/2022 06:19 AM    CL 115 11/11/2022 06:19 AM    CO2 25 11/11/2022 06:19 AM    BUN 84 11/11/2022 06:19 AM    CREATININE 1.6 11/11/2022 06:19 AM    GLUCOSE 175 11/11/2022 06:19 AM    CALCIUM 8.3 11/11/2022 06:19 AM        Assessment/Plan     Goals of Care  Palliative care was consulted on 3/12 and able to discuss with all 3 of the patient's children who all agreed that a comfort care approach was in the patient's best interest and what the patient would have wanted.  She is now for comfort care.  She will be evaluated for inpatient hospice.    CVA with Right facial droop, right hemiplegia, Dysphagia   Known left carotid stenosis. MRI, MRA head and neck showed no LVO. imaging shows several areas of stroke.  Her condition is severely debilitating.  She is unable to swallow, unable to communicate verbally, and  has complete right-sided hemiplegia.  She was receiving tube feeds.  She is now full comfort care, so tube feeds have been stopped and is being evaluated  for inpatient hospice.       Metabolic encephalopathy  May have been down as long as 6 days. Unclear if fall was caused by a stroke or if patient may subsequently had a stroke while lying on the ground.  Unable to communicate verbally given her severe stroke.      Hypernatremia  Na 149 again today.  She was started on D5W in addition to her frequent free water flushes.  IV fluids, TFs as well as lab checks have been stopped as she is now full comfort care.     Aspiration pneumonia with severe sepsis +/- hypovolemic shock  Has received 6 days of zosyn. Will  continue for the time being as this could help with sacral pain/inflammation from infected decubitus ulcer.     AKI  Admitted with acute kidney injury. Likely prerenal in etiology  -no further lab checks or IVFs as patient is comfort care     Sacral wound with eschar, present on admission  SEPSIS present on admission  Polymicrobial bacteremia (Globicatella sanguinis, Aerococcus viridans, proteus mirabilis)  Approximately 13 x10 cm eschar in sacral area with fluctuance. Due to prolonged downtime  Wound care following. General surgery consulted and patient underwent debridement in OR on 3/11  ID following. Continuing zosyn for now. Can stop antibiotics upon discharge or if not felt to be providing any improvement in comfort  Elevated troponin, aortic valve stenosis  Likely stress-induced ischemia. Echo: Mod Aortic Valve stenosis. AVA 1.6 cm2, peak AV grad 23, severe mitral annular calcification     Rhabdomyolysis  - patient found down at home, likely down for approx 6 days according to family  - CPK  peaked at 800 and is down trending  - lactic acidosis resolved     Prophylaxis/Bundle    Diet: Diet NPO  ADULT TUBE FEEDING; Nasogastric; Peptide Based; Continuous; 25; Yes; 10; Q 4 hours; 55; 150; Q 1 hour; Wound Healing; 1 Dose; BID  COMFORT CARE BASKET; 2 to 4  Dvt prophy: None, comfort care  DNR  Dispo: Possible inpatient hospice  Medical Decision Maker: No HCPOA. Decisions made via her three children.  Garlon Hatchet, MD Hospitalist

## 2022-11-11 NOTE — Progress Notes (Signed)
Infectious Diseases Progress Note      Subjective  Sleeping soundly. Currently on RA. DBHF in place. Family member does not wish patient to be disturbed.     Medications:  Current Facility-Administered Medications   Medication Dose Route Frequency Provider Last Rate Last Admin    atorvastatin (LIPITOR) tablet 40 mg  40 mg Oral Nightly Bobbye Charleston, MD   40 mg at 11/10/22 2049    lactated ringers IV soln infusion   IntraVENous Continuous Hollice Espy, MD        piperacillin-tazobactam (ZOSYN) 3,375 mg in sodium chloride 0.9 % 100 mL IVPB (mini-bag)  3,375 mg IntraVENous Q8H Otilio Connors, MD 25 mL/hr at 11/11/22 0608 3,375 mg at 11/11/22 [id number]    aspirin chewable tablet 81 mg  81 mg Per NG tube Daily Senaida Ores, PA   81 mg at 11/09/22 [id number]    traMADol (ULTRAM) tablet 50 mg  50 mg Oral Q6H PRN Nuss, Dayton Martes, APRN - NP   50 mg at 11/11/22 0816    sodium chloride flush 0.9 % injection 5-40 mL  5-40 mL IntraVENous 2 times per day Algis Liming, MD   10 mL at 11/11/22 0816    sodium chloride flush 0.9 % injection 5-40 mL  5-40 mL IntraVENous PRN Algis Liming, MD   10 mL at 11/09/22 2107    0.9 % sodium chloride infusion   IntraVENous PRN Algis Liming, MD        ondansetron (ZOFRAN-ODT) disintegrating tablet 4 mg  4 mg Oral Q6H PRN Algis Liming, MD        Or    ondansetron [facility]) injection 4 mg  4 mg IntraVENous Q4H PRN Algis Liming, MD        bisacodyl (DULCOLAX) EC tablet 5 mg  5 mg Oral Daily PRN Algis Liming, MD        senna [facility]) tablet 8.6 mg  1 tablet Oral Daily PRN Algis Liming, MD        aluminum & magnesium hydroxide-simethicone (MAALOX) [id number] MG/5ML suspension 30 mL  30 mL Oral Q6H PRN Algis Liming, MD        acetaminophen (TYLENOL) tablet 650 mg  650 mg Oral Q6H PRN Algis Liming, MD   650 mg at 11/09/22 1805    Or    acetaminophen (TYLENOL) suppository 650 mg  650 mg Rectal Q6H PRN Algis Liming, MD        heparin (porcine) injection 5,000 Units  5,000 Units SubCUTAneous 3 times per day Algis Liming, MD   5,000 Units at 11/10/22 [id number]      Review of Systems:  Limited due to AMS.     Objective:  Physical Exam:  BP 126/62   Pulse 91   Temp 98.4 F (36.9 C) (Axillary)   Resp 16   Ht 1.73 m (5' 8.11")   Wt 100.4 kg (221 lb 5.5 oz)   SpO2 94%   BMI 33.55 kg/m   General: Comfortable  Neck: Supple, non-tender, no carotid bruits, no JVD, no lymphadenopathy.  Lungs: Clear anteriorly, non-labored respiration.  Heart: Normal rate, regular rhythm, no murmur, gallop or edema.  Abdomen: Soft, non-tender, non-distended, normal bowel sounds, no masses.  Musculoskeletal: Normal range of motion and strength, no tenderness or swelling.  Skin: Skin is warm, dry, no rashes or lesions.  Dobhoff, R IJ, foley      Lab Results:  Lab Results  Component Value Date/Time    WBC 15.1 11/11/2022 06:19 AM    RBC 2.89 11/11/2022 06:19 AM    HGB 8.4 11/11/2022 06:19 AM    HCT 27.1 11/11/2022 06:19 AM    PLT 177 11/11/2022 06:19 AM    MCV 93.8 11/11/2022 06:19 AM    LYMPHOPCT 8.3 11/10/2022 06:42 AM     Lab Results   Component Value Date/Time    NA 149 11/11/2022 06:19 AM    K 3.9 11/11/2022 06:19 AM    CL 115 11/11/2022 06:19 AM    CO2 25 11/11/2022 06:19 AM    BUN 84 11/11/2022 06:19 AM    CREATININE 1.6 11/11/2022 06:19 AM    CALCIUM 8.3 11/11/2022 06:19 AM    LABGLOM 31 11/11/2022 06:19 AM    GLUCOSE 175 11/11/2022 06:19 AM       Diagnostic Results:      Assessment/Plan:    Polymicrobial bacteremia.     11/06/22 Bcx x1 Proteus mirabilis, other B cx x1 Globicatella, Aerococcus, Paenalicalignes.      Suspected source sacral decub  Repeat blood culture 11/10/22 pending  TTE negative  Continue Zosyn.      AKI/CKD.     Antimicrobial properly dosed.      Sacral decubitus.     MRSA screen negative  I&D performed to bone 11/10/22  Tissue culture 11/10/22 pending  Wound care.      Possible pneumonia.     CXRs  noted.  Zosyn adequate coverage.     Leukocytosis.    Multifactorial.  Monitor trend.         Fransico Him, DO, MBA, FACP, [place]
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
## 2022-11-11 NOTE — Progress Notes (Signed)
Inpatient     Acute Care Speech Pathology Dysphagia Treatment Note  Acknowledge Orders  Time In/Out  SLP Charge Capture  Rehab Caseload Tracker    Therapy Minute Tracking       SLP Individual Minutes [id number]  9      Assessment  General Comment  Comments: Pt seen at bedside c assistance to reposition from PCT. No family in room. Oral care provided c moist oral cavity noted. Pt unable to follow commands to lateralize tongue, elevate tongue , lick lips despite max visual cues. Pt did maintain eye contact c SLP throughout instruction when cued. Presented with simple yes/no questions- reduced initiation to nof answers c pt nodding yes to all questions. Difficulty discerning apraxia versus aphasia. Pt presented with single ice chip trials c bolus holding, no initiation to manipulate the bolus despite max cues to initiate oral manipulation via imitation cues. Ice chip had to be physically removed from mouth. Educated NSG on recs to continue NPO c consideration for long term nutritional needs as safest option, if desired to meet nutritional needs. Pt was checked for incontinance c catheter still in place by PCT with no further needs needed.  Pt's call bell left in left hand upon departure per posted sign.  Behavior/Cognition  Behavior/Cognition: Alert, Cooperative, Pleasant mood (smiling and laughing at appropriate times;)        Goals     Long-term Goals  Goal 1: Tolerate least restrictive diet to meet long term nutritional needs safely by mouth.  Goal 2: Demonstrate functional communication via multi modalities to make needs known to unfamiliar communication partner.  Requires SLP Intervention: Yes  Recommendations: NPO, Consider alternative nutrition, Therapeutic feeds with SLP only, Dysphagia treatment  D/C Recommendations: Ongoing speech therapy is recommended during this hospitalization, Ongoing speech therapy is recommended at next level of care, 24 hour supervision/assistance  Referral To: OT, PT,  Dietician  Diet Solids Recommendation: NPO  Liquid Consistency Recommendation: NPO  Recommended Form of Meds: Via alternative means of nutrition  Therapeutic Interventions: Bolus control exercises, Patient/Family education, Diet tolerance monitoring, Oral care, Therapeutic PO trials with SLP, Oral motor exercises, Free Water Protocol, Thermal stimulation  Patient Education: Pt nodded head in attempt to communicate c SLP to answer yes/no understanding that NPO rec'd.  Patient Education Response: Demonstrated understanding         Dysphagia Exercises     Dysphagia Outcome Severity Scale: Level 1: Severe dysphagia- NPO. Unable to tolerate any PO safely       Recommendations  Requires SLP Intervention: Yes  Recommendations: NPO, Consider alternative nutrition, Therapeutic feeds with SLP only, Dysphagia treatment  Referral To: OT, PT, Dietician  Diet Solids Recommendation: NPO  Liquid Consistency Recommendation: NPO  Recommended Form of Meds: Via alternative means of nutrition  Therapeutic Interventions: Bolus control exercises, Patient/Family education, Diet tolerance monitoring, Oral care, Therapeutic PO trials with SLP, Oral motor exercises, Free Water Protocol, Thermal stimulation  Patient Education: Pt nodded head in attempt to communicate c SLP to answer yes/no understanding that NPO rec'd.  Patient Education Response: Demonstrated understanding  Duration of Treatment: 2 wks  Frequency of Treatment: 2-5/wk      Safety  Safety Devices in place: Yes  Type of devices: Bed alarm in place, Nurse notified  Restraints Initially in Place: No   Bed alarm in place, Nurse notified , call bell in left hand

## 2022-11-11 NOTE — Progress Notes (Signed)
Soft touch callbell obtained from [employer] and placed in patient's left hand. Patient demonstrated patient able to use callbell. Callbell labeled "Please place callbell in left hand." Sign over patient's bed in place reminding staff to place callbell on patient's left side.

## 2022-11-11 NOTE — Wound Image (Signed)
WOC follow up at request of General Surgery to place a wound VAC to sacral pressure injury.  Wound site was initially an eschar covered unstageable pressure injury.  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  Exposed bone, tendon, muscle, and subcutaneous tissue present.  Wound VAC placed after pre-medication with Ultram.  1 Hydrofera blue classic foam placed over bone/tendon; 2 black foam placed in wound bed.  Goal of VAC therapy is comfort, and prevention of multiple dressing changes daily.  Patient is very painful and stiff when turning - Palliative care following.  WOC to change VAC dressing weekly in accordance with above goals.  Should family choose an aggressive path for patient, WOC will need to change 2-3 times weekly.  WOC to await outcome of Palliative conversations.  RD following for nutritional requirements.  Patient is on a Hill-Rom P500 air surface for comfort and prevention.  Air offloading boots in place.  Braden precaution measures continued as patient's mobility is severely limited.  Report given to RN and Dr. Atilano Ina.  VAC orders confirmed with Gladstone Lighter, PA-C with surgery.  WOC to continue to monitor while hospitalized.  Orders updated in Epic.     11/11/22 0945   Wound 11/06/22 Sacrum (Stage 4 Pressure Injury)   Date First Assessed/Time First Assessed: 11/06/22 1141   Present on Original Admission: Yes  Primary Wound Type: (c) Pressure Injury  Location: Sacrum  Wound Description (Comments): (Stage 4 Pressure Injury)   Wound Image    Wound Etiology Pressure Stage 4   Dressing Status New dressing applied   Wound Cleansed Wound cleanser   Dressing/Treatment Negative pressure wound therapy   Wound Length (cm) 15 cm   Wound Width (cm) 15 cm   Wound Depth (cm) 5 cm   Wound Surface Area (cm^2) 225 cm^2   Change in Wound Size % (l*w) -134.38   Wound Volume (cm^3) 1125 cm^3   Wound Assessment Exposed structure bone;Exposed structure muscle;Exposed structure  tendon;Pink/red   Drainage Amount Moderate (25-50%)   Drainage Description Serosanguinous   Odor None   Peri-wound Assessment Fragile   Wound Thickness Description not for Pressure Injury Full thickness   Negative Pressure Wound Therapy Sacrum   Placement Date: 11/11/22   Location: Sacrum   $ Standard NPWT >50 sq cm PER TX $ Yes   Wound Type Pressure ulcer: Stage IV   Unit Type 67M [place]   Dressing Type Black Foam;Other (Comment)  (hydrofera blue classic foam)   Number of pieces used 3  (2 black; 1 purple)   Cycle Continuous   Target Pressure (mmHg) 125

## 2022-11-11 NOTE — Progress Notes (Signed)
Comprehensive Nutrition Assessment    Type and Reason for Visit:  Reassess    Nutrition Recommendations/Plan:   Advance TF to goal today via NEFT: Vital AF 1.2 at 55 ml/hr + 150 ml/hr flush + Juven BID with 120 ml H20 flush after each juven- provides 1320 ml formula, 1744 kcal, 99 g protein, 154 g carb, 1075m H20 without flushes, 4909 ml H20 with flushes (97%EER, 93%EPR). Current FWD= 3.2 L. MD to add D5 to address hypernatremia.   *once Na WNL, recommend 20 ml/hr flush which would provide 1789 ml H20 with flushes (100%estimated fluid needs).     2. Diet advancement per SLP if medically able.      Malnutrition Assessment:  Malnutrition Status:  Moderate malnutrition (11/07/22 1122)    Context:  Acute Illness     Findings of the 6 clinical characteristics of malnutrition:  Energy Intake:  50% or less of estimated energy requirements for 5 or more days  Weight Loss:        Body Fat Loss:        Muscle Mass Loss:  Mild muscle mass loss Temples (temporalis), Clavicles (pectoralis & deltoids)  Fluid Accumulation:       Grip Strength:       Nutrition Assessment:    87y.o. female admitted to the ICU after being found down on the floor. Dx Aspiration PNA, septic shock, bacteremia, sacral wound, metabolic encephalopathy, CVA, AKI, hypernatremia, dysphagia, respiratory failure, rhabdomyolysis, chronic compression fracture, elevated troponin-likely stress induced ischemia. Echo 3/8-EF 65-70%. SLP c/s 3/8: recommended NPO. S/p sacral wound debridement in OR on 3/11. Transferred to floor.     Past medical history: aortic valve stenosis, left carotid stenosis, spinal cord injury     Scheduled Meds:   atorvastatin  40 mg Oral Nightly    piperacillin-tazobactam  3,375 mg IntraVENous Q8H    aspirin  81 mg Per NG tube Daily    sodium chloride flush  5-40 mL IntraVENous 2 times per day    heparin (porcine)  5,000 Units SubCUTAneous 3 times per day     Continuous Infusions:   dextrose 100 mL/hr at 11/11/22 1044    sodium chloride        PRN Meds:.traMADol, sodium chloride flush, sodium chloride, ondansetron **OR** ondansetron, bisacodyl, senna, aluminum & magnesium hydroxide-simethicone, acetaminophen **OR** acetaminophen    Lab Results   Component Value Date/Time    NA 149 11/11/2022 06:19 AM    K 3.9 11/11/2022 06:19 AM    CL 115 11/11/2022 06:19 AM    CO2 25 11/11/2022 06:19 AM    BUN 84 11/11/2022 06:19 AM    CREATININE 1.6 11/11/2022 06:19 AM    GLUCOSE 175 11/11/2022 06:19 AM    CALCIUM 8.3 11/11/2022 06:19 AM    PHOS 3.0 11/11/2022 06:19 AM    MG 2.3 11/11/2022 06:19 AM     Lab Results   Component Value Date/Time    TRIG 152 11/10/2022 06:42 AM    TRIG 129 02/03/2022 12:12 PM     Lab Results   Component Value Date/Time    POCGLU 162.0 11/11/2022 06:38 AM    POCGLU 137.0 11/08/2022 12:21 PM    POCGLU 123.0 11/06/2022 05:18 PM    POCGLU 129.0 11/06/2022 04:44 PM          Current Nutrition Intake & Therapies:    Average Meal Intake: NPO  Average Supplements Intake: NPO  Diet NPO  ADULT TUBE FEEDING; Nasogastric; Peptide Based; Continuous; 25; Yes; 10;  Q 4 hours; 55; 150; Q 1 hour; Wound Healing; 1 Dose; BID, NKFA    NEFT placed and TF initiated 3/8, currently running at 45 ml/hr + 150 ml/hr flush and pt tolerating per RN, denies N/V/D/C. LBM 3/11. Plans to increase to goal at 10 am this morning.     Nutrition Related Findings:      Wound Type: Pressure Injury, Unstageable, Deep Tissue Injury (DTI of upper back, unstageable pressure injury of sacrum.-WOCN following.)     Per WOCN note 3/12:  Sacral pressure injury-Wound site was initially an eschar covered unstageable pressure injury.  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  Exposed bone, tendon, muscle, and subcutaneous tissue present.  Wound VAC placed.       Anthropometric Measures:  Height: 173 cm (5' 8.11")  Ideal Body Weight (IBW): 141 lbs (64 kg)       Current Body Weight: 88 kg (194 lb 0.1 oz), 137.6 % IBW.    Current BMI (kg/m2):  29.4  Usual Body Weight: 88.2 kg (194 lb 7.1 oz)  % Weight Change (Calculated): -0.2  BMI Categories: Overweight (BMI 25.0-29.9)    Wt hx:  88.2kg (02/11/22)  88kg (11/06/22)-Wt relatively stable per EMR.  100.4 kg (3/10)- +2 BLE edema     Estimated Daily Nutrient Needs:  Energy Requirements Based On: Formula  Weight Used for Energy Requirements: Current  Energy (kcal/day): 1789kcal (MSJX1.3)  Weight Used for Protein Requirements: Current  Protein (g/day): 106-132g (1.2-1.5g/kg)  Method Used for Fluid Requirements: 1 ml/kcal  Fluid (ml/day): 1776m (154mkcal) or per MD.  Carb (g/day): 224 g (50%EER)    Nutrition Diagnosis:   Moderate malnutrition, In context of acute illness or injury related to inadequate protein-energy intake as evidenced by Criteria as identified in malnutrition assessment (pt NPO pending SLP assessment.)    Nutrition Interventions:   Food and/or Nutrient Delivery: Continue Current Tube Feeding             Goals:  Previous Goal Met: Progressing toward Goal(s)  Goals: Meet at least 75% of estimated needs, Initiate nutrition support, Tolerate nutrition support at goal rate, by next RD assessment, other (specify)  Specify Other Goals: Prevent weight loss.    Nutrition Monitoring and Evaluation:      Food/Nutrient Intake Outcomes: Diet Advancement/Tolerance, Enteral Nutrition Intake/Tolerance  Physical Signs/Symptoms Outcomes: Biochemical Data, Chewing or Swallowing, Skin, Weight    Discharge Planning:    Too soon to determine     RD to f/u within 7 days.   AnGreer EeRD  Contact: Thank you for allowing me to participate in the care of this patient.   Please contact your Registered Dietitian with any questions or concerns.     [place]: [address]: 84[phone] message your clinical nutrition team via [facility]
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
## 2022-11-12 LAB — CULTURE, BLOOD 1

## 2022-11-12 MED ORDER — MORPHINE SULFATE (PF) 4 MG/ML IJ SOLN
4 | INTRAMUSCULAR | Status: DC | PRN
Start: 2022-11-12 — End: 2022-11-12
  Administered 2022-11-12 (×3): 4 mg via INTRAVENOUS

## 2022-11-12 MED ORDER — MORPHINE SULFATE (PF) 4 MG/ML IJ SOLN
4 | INTRAMUSCULAR | Status: DC | PRN
Start: 2022-11-12 — End: 2022-11-12

## 2022-11-12 MED FILL — PIPERACILLIN SOD-TAZOBACTAM SO 3.375 (3-0.375) G IV SOLR: 3.375 (3-0.375) g | INTRAVENOUS | Qty: 3375

## 2022-11-12 MED FILL — MORPHINE SULFATE (PF) 4 MG/ML IJ SOLN: 4 mg/mL | INTRAMUSCULAR | Qty: 1

## 2022-11-12 MED FILL — NORMAL SALINE FLUSH 0.9 % IV SOLN: 0.9 % | INTRAVENOUS | Qty: 10

## 2022-11-12 NOTE — Progress Notes (Signed)
Per palliative notes, plans to transition to comfort care/hospice. Will sign off at this time - please re-consult if a new need arises.

## 2022-11-12 NOTE — Progress Notes (Signed)
Occupational Therapy Attempt Note  Rehab Caseload Track      Order for Re-eval received. Per chart review, pt now comfort care and being evaluated for IP hospice. Will d/c current orders as skilled therapy contraindicated at this time.     Acie Fredrickson Jahrel Borthwick, OT
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
## 2022-11-12 NOTE — Progress Notes (Signed)
Physical Therapy Attempt Note    Order for PT re-eval received and chart reviewed. Per palliative notes, plans to transition to comfort care/hospice. Will sign off at this time - please re-consult if a new need arises.    Selena Batten, PT, DPT    Rehab Caseload Tracker

## 2022-11-12 NOTE — Progress Notes (Signed)
Nutrition Note    Nutrition monitoring:    NPO, previously on TF via NEFT. However, pt now comfort care so TF order d/c. Plans for d/c with hospice today.     RD to sign off. Please c/s RD prn if conditions change.   Electronically signed by Sheppard Plumber, RD on 11/12/22 at 11:13 AM EDT    Contact: Thank you for allowing me to participate in the care of this patient.   Please contact your Registered Dietitian with any questions or concerns.     [place]: [place]: [place]: [facility]: [phone]  Or message your clinical nutrition team via [facility]

## 2022-11-12 NOTE — Wound Image (Signed)
WOC notified by RN, Caryl Asp, that transport was present to take Ms. Manz to [facility].  Case management notified hospice of NPWT use to sacrum.  WOC instructed Joy to send patient with her VAC dressing clamped.  NPWT or hospice's dressing of choice to be applied on arrival to hospice facility.

## 2022-11-12 NOTE — Progress Notes (Signed)
Infectious Diseases Progress Note      Subjective  Transitioned to comfort care. RN at bedside. Will sign off.               Fransico Him, DO, MBA, [place], [place]

## 2022-11-12 NOTE — Discharge Summary (Addendum)
[facility] Hospitalist Service Discharge Summary    Patient Name:  Ashley Davies    Patient DOB:  22-Dec-1935   MRN:  [id number]   Admit date:  11/06/2022   Discharge date:   11/12/2022    Admitting Physician:  Tonye Royalty, MD    Discharge Physician:  Jovita Kussmaul, MD    Discharge Diagnoses:   See bolded headings in hospital course   Consults:   IP WOUND CARE NURSE CONSULT TO EVAL  IP CONSULT TO GENERAL SURGERY  IP CONSULT TO PALLIATIVE CARE  IP CONSULT TO INFECTIOUS DISEASES  IP CONSULT TO PALLIATIVE CARE  IP CONSULT TO PICC TEAM  IP CONSULT TO SPIRITUAL SERVICES  IP CONSULT TO HOSPICE  IP CONSULT TO CASE MANAGEMENT   Code status  DNR    Disposition:  [facility]   Findings Requiring Follow-Up:       Hospital Stay   HPI (from admission H&P):     87 y.o. female with significant past medical history of aortic valve stenosis, left carotid stenosis found down on the floor of her apartment after family did not hear from her.  The patient is improving and able to provide some history she believes that she has been on floor since 3 /1.  She reports that she fell and tries to explain why but her speech is decipherable.  She reports that she was too weak to get up.  Per her son this could be the case given the last time they talk to her.  She was found by someone in her apartment complex.  At this time she denies chest pain.  She reports that she hurts all over but mostly in her back.  She denies previous stroke.  Her son reports that she mainly keeps her medical history to herself and she tells them that she has been healthy.  Reviewing the records it looks like she has had several missed appointments as an outpatient and was supposed to have carotid ultrasound as well as an echo recently that she missed.     Hospital Course:     Goals of Care  Palliative care was consulted on 3/12 and able to discuss with all 3 of the patient's children who all agreed that a comfort care approach was in the patient's best interest and  what the patient would have wanted.  She is now for comfort care.  She will be discharging to [facility]. She is requiring frequent IV morphine for pain.     CVA with Right facial droop, right hemiplegia, Dysphagia   Known left carotid stenosis. MRI, MRA head and neck showed no LVO. imaging shows several areas of stroke.  Her condition is severely debilitating.  She is unable to swallow, unable to communicate orally, and has complete right-sided hemiplegia.  Initially evaluated by neurology and started on aspirin. TTE without vegetations or thrombus. She is now full comfort care, so tube feeds have been stopped and is being evaluated  for inpatient hospice.       Metabolic encephalopathy  May have been down as long as 6 days. Unclear if fall was caused by a stroke or if patient may subsequently had a stroke while lying on the ground.  Unable to communicate verbally given her severe stroke, so her mental status is tough to accurately gauge.     Hypernatremia   She was treated with IV  D5W in addition to her frequent free water flushes.  IV fluids, TFs as well  as lab checks have been stopped as she is now full comfort care.     Aspiration pneumonia with severe sepsis +/- hypovolemic shock  Received 7days of zosyn. No need for antibiotics on discharge     AKI  Admitted with acute kidney injury. Creatinine stabilized around 1.6  No further lab checks or IVFs as patient is comfort care     Sacral wound with eschar, present on admission  SEPSIS present on admission  Polymicrobial bacteremia (Globicatella sanguinis, Aerococcus viridans, proteus mirabilis)  Approximately 13 x10 cm eschar in sacral area with fluctuance. Due to prolonged downtime  Wound care following. General surgery consulted and patient underwent debridement in OR on 3/11  ID was consulted. She was treated with IV zosyn x 7 days. Antibiotics discontinued prior to d/c to hospice.     Elevated troponin, aortic valve stenosis  Likely stress-induced  ischemia. Echo: Mod Aortic Valve stenosis. AVA 1.6 cm2, peak AV grad 23, severe mitral annular calcification     Rhabdomyolysis  Patient found down at home, likely down for approx 6 days according to family. CPK  peaked at 800 and is down trending    lactic acidosis   Resolved, from sepsis and hypovolemia     Discharge Exam:  Blood pressure (!) 154/57, pulse 87, temperature 98.9 F (37.2 C), temperature source Axillary, resp. rate 18, height 1.73 m (5' 8.11"), weight 100.4 kg (221 lb 5.5 oz), SpO2 94 %. Body mass index is 33.55 kg/m.  General: Ill appearing elderly white female  HEENT: R facial droop  Cardiac: regular rate and rhythm, no clicks, rubs, or murmurs   Pulmonary: clear to auscultation bilaterally, no respiratory distress  GI: soft, nondistended  MSK/Skin: Large sacral wound, bandage in place cdi  Neuro:tracks intermittently with eyes and follows some commands: squeezes with left hand when asked, and nods head at times, otherwise unable to communicate.Right hemiplegia    Surgeries/Procedures Performed:  Procedure(s):  SACRAL WOUND DEBRIDEMENT     Microbiology:  Organism   Date Value Ref Range Status   11/10/2022 Proteus mirabilis (A)         Recent Labs: CBC:   Lab Results   Component Value Date/Time    WBC 15.1 11/11/2022 06:19 AM    RBC 2.89 11/11/2022 06:19 AM    HGB 8.4 11/11/2022 06:19 AM    HCT 27.1 11/11/2022 06:19 AM    MCV 93.8 11/11/2022 06:19 AM    MCH 29.1 11/11/2022 06:19 AM    MCHC 31.0 11/11/2022 06:19 AM    RDW 14.6 11/11/2022 06:19 AM    PLT 177 11/11/2022 06:19 AM     BMP:    Lab Results   Component Value Date/Time    GLUCOSE 175 11/11/2022 06:19 AM    NA 149 11/11/2022 06:19 AM    K 3.9 11/11/2022 06:19 AM    CL 115 11/11/2022 06:19 AM    CO2 25 11/11/2022 06:19 AM    ANIONGAP 9 11/11/2022 06:19 AM    BUN 84 11/11/2022 06:19 AM    CREATININE 1.6 11/11/2022 06:19 AM    CALCIUM 8.3 11/11/2022 06:19 AM    LABGLOM 31 11/11/2022 06:19 AM       Imaging:  XR ABDOMEN (KUB) (SINGLE AP  VIEW)    Result Date: 11/11/2022  Enteric tube tip located transverse duodenum.    XR CHEST PORTABLE    Result Date: 11/10/2022  An enteric tube is present, the tip is below the field-of-view, but beyond the mid second  portion of duodenum. If more detailed localization of the tip is needed then an abdominal radiograph could be obtained.    XR CHEST PORTABLE    Result Date: 11/06/2022  Placement of right IJ central line with its tip in SVC and no pneumothorax appreciated.    XR CHEST PORTABLE    Result Date: 11/06/2022  Impression:Pulmonary venous hypertension.     MRA NECK W CONTRAST    Result Date: 11/06/2022  1. MRA neck: Limited by excessive motion artifact Right ICA, mild to moderate narrowing, likely not exceeding 30-40% Left ICA, mild narrowing at the origin, small vascular structure near the bifurcation,                Cannot exclude 5 mm ulcerated plaque Vertebral arteries are bilaterally patent, proximal segments, origins, as well as the origins of the other brachiocephalic vessels are not well visualized, cannot differentiate artifact from anatomic    MRI BRAIN WO CONTRAST    Result Date: 11/06/2022  1. MRI brain 14 mm acute perforator infarct left pons 7 mm acute infarct right anterior inferior cerebellum 4 mm acute/subacute lacunar infarct left frontal, periventricular white matter No hemorrhage, severe underlying small vessel change Generalized atrophy with prominent temporal lobe involvement 2. MRA brain No large vessel occlusion. Left ACA, severe stenosis Left M2, moderate stenosis Right P2, severe stenosis proximal and distal segment Left P2, severe stenosis mid segment    MRA HEAD WO CONTRAST    Result Date: 11/06/2022  1. MRI brain 14 mm acute perforator infarct left pons 7 mm acute infarct right anterior inferior cerebellum 4 mm acute/subacute lacunar infarct left frontal, periventricular white matter No hemorrhage, severe underlying small vessel change Generalized atrophy with prominent temporal lobe  involvement 2. MRA brain No large vessel occlusion. Left ACA, severe stenosis Left M2, moderate stenosis Right P2, severe stenosis proximal and distal segment Left P2, severe stenosis mid segment    CT ABDOMEN PELVIS WO CONTRAST Additional Contrast? None    Result Date: 11/06/2022  1. No definite acute findings by noncontrast technique.  2. 9 mm nonobstructing right kidney stone. 2 mm stone urinary bladder. Urinary bladder decompressed by Foley catheter. 3. Pelvic floor relaxation. 4. Bones appear osteopenic. Multilevel compression deformities lumbar spine and at T12 are technically age-indeterminate but favored to be chronic. Correlate for point tenderness.    CT Head W/O Contrast    Result Date: 11/06/2022  No acute intracranial abnormality.   Chronic findings as above.    XR CHEST PORTABLE    Result Date: 11/06/2022  Consolidative opacities in the right midlung and left lung base. Findings could reflect pneumonia in the appropriate clinical context, however underlying nodule  not excluded. At a minimum, recommend short-term follow-up two-view chest radiograph in 4-6 weeks for further evaluation.     11/06/22    ECHO (TTE) COMPLETE (PRN CONTRAST/BUBBLE/STRAIN/3D) 11/07/2022  3:19 PM (Final)    Interpretation Summary    Left Ventricle: Normal left ventricular systolic function with a visually estimated EF of 65 - 70%. Left ventricle size is normal. Mildly increased wall thickness. Moderate basal septal thickening. Normal diastolic function.    Right Ventricle: Right ventricle size is normal. Normal systolic function.    Aortic Valve: Mild sclerotic changes Moderate stenosis of the aortic valve. AV mean gradient is 23 mmHg. AV peak velocity is 3.1 m/s. AV area by continuity VTI is 1.6 cm2.    Mitral Valve: Severe annular calcification of the mitral valve.  Leaflets appear mobile, mild  fibrocalcific changes.    Left Atrium: Left atrium is mildly dilated.    Aorta: Normal sized aortic root and ascending aorta.     Pericardium: No pericardial effusion.    Image quality is adequate. Technically difficult study.    Signed by: Berlin Hun, MD on 11/07/2022  3:19 PM       Discharge Medications      No current outpatient medications     ---  Current Discharge Medication List         Discharge Plan   Provider Follow-Up:   No follow-up provider specified.     In process/preliminary results:  Outstanding Order Results       Date and Time Order Name Status Description    11/11/2022  8:06 AM Add On Lab Test In process     11/10/2022  5:19 PM Culture, Blood 1 Preliminary     11/10/2022  4:53 PM Culture, Tissue Preliminary     11/10/2022  3:26 PM Add On Lab Test In process     11/10/2022  2:05 PM Culture, Wound Gram Stain; Aerobic Only In process     11/06/2022  2:39 PM Add On Lab Test In process     11/06/2022 12:14 PM Blood Culture 2 Preliminary             Patient Instructions     Discharge Activity:  bedrest   Discharge Diet:  Diet NPO    Discharge Wound Care: as directed   Special instructions       Time Spent on Discharge:  39 minutes were spent in patient examination, evaluation, counseling as well as medication reconciliation, prescriptions for required medications, discharge plan and follow up.      Lewie Loron MD  Hospitalist 11/12/22  10:26am

## 2022-11-17 ENCOUNTER — Ambulatory Visit: Payer: PRIVATE HEALTH INSURANCE | Primary: Internal Medicine
